# Patient Record
Sex: Female | Born: 1980 | Race: Black or African American | Hispanic: No | Marital: Married | Smoking: Never smoker
Health system: Southern US, Community
[De-identification: ages and names within clinical notes are randomized; demographics above are authoritative.]

## PROBLEM LIST (undated history)

## (undated) ENCOUNTER — Inpatient Hospital Stay (HOSPITAL_COMMUNITY): Payer: Self-pay

## (undated) DIAGNOSIS — R000 Tachycardia, unspecified: Secondary | ICD-10-CM

## (undated) DIAGNOSIS — O30009 Twin pregnancy, unspecified number of placenta and unspecified number of amniotic sacs, unspecified trimester: Secondary | ICD-10-CM

## (undated) DIAGNOSIS — N390 Urinary tract infection, site not specified: Secondary | ICD-10-CM

## (undated) DIAGNOSIS — N979 Female infertility, unspecified: Secondary | ICD-10-CM

## (undated) DIAGNOSIS — J019 Acute sinusitis, unspecified: Secondary | ICD-10-CM

## (undated) DIAGNOSIS — R87629 Unspecified abnormal cytological findings in specimens from vagina: Secondary | ICD-10-CM

## (undated) DIAGNOSIS — N898 Other specified noninflammatory disorders of vagina: Secondary | ICD-10-CM

## (undated) DIAGNOSIS — Z8759 Personal history of other complications of pregnancy, childbirth and the puerperium: Secondary | ICD-10-CM

## (undated) DIAGNOSIS — E282 Polycystic ovarian syndrome: Secondary | ICD-10-CM

## (undated) DIAGNOSIS — N926 Irregular menstruation, unspecified: Secondary | ICD-10-CM

## (undated) DIAGNOSIS — R8290 Unspecified abnormal findings in urine: Secondary | ICD-10-CM

## (undated) HISTORY — DX: Other specified noninflammatory disorders of vagina: N89.8

## (undated) HISTORY — DX: Twin pregnancy, unspecified number of placenta and unspecified number of amniotic sacs, unspecified trimester: O30.009

## (undated) HISTORY — DX: Irregular menstruation, unspecified: N92.6

## (undated) HISTORY — PX: COLPOSCOPY: SHX161

## (undated) HISTORY — DX: Tachycardia, unspecified: R00.0

## (undated) HISTORY — DX: Polycystic ovarian syndrome: E28.2

## (undated) HISTORY — DX: Unspecified abnormal findings in urine: R82.90

## (undated) HISTORY — DX: Female infertility, unspecified: N97.9

## (undated) HISTORY — PX: ENDOMETRIAL BIOPSY: SHX622

## (undated) HISTORY — DX: Acute sinusitis, unspecified: J01.90

## (undated) HISTORY — DX: Urinary tract infection, site not specified: N39.0

---

## 2013-06-01 LAB — OB RESULTS CONSOLE ABO/RH: RH TYPE: POSITIVE

## 2013-06-01 LAB — OB RESULTS CONSOLE GC/CHLAMYDIA
CHLAMYDIA, DNA PROBE: NEGATIVE
Gonorrhea: NEGATIVE

## 2013-06-01 LAB — OB RESULTS CONSOLE RPR: RPR: NONREACTIVE

## 2013-06-01 LAB — OB RESULTS CONSOLE RUBELLA ANTIBODY, IGM: Rubella: IMMUNE

## 2013-06-01 LAB — OB RESULTS CONSOLE HEPATITIS B SURFACE ANTIGEN: Hepatitis B Surface Ag: NEGATIVE

## 2013-06-01 LAB — OB RESULTS CONSOLE HIV ANTIBODY (ROUTINE TESTING): HIV: NONREACTIVE

## 2013-06-01 LAB — OB RESULTS CONSOLE ANTIBODY SCREEN: ANTIBODY SCREEN: NEGATIVE

## 2013-09-25 ENCOUNTER — Encounter: Payer: Self-pay | Admitting: Cardiovascular Disease

## 2013-09-25 ENCOUNTER — Ambulatory Visit (INDEPENDENT_AMBULATORY_CARE_PROVIDER_SITE_OTHER): Payer: BC Managed Care – PPO | Admitting: Cardiovascular Disease

## 2013-09-25 VITALS — BP 112/70 | HR 105 | Ht 65.0 in | Wt 282.8 lb

## 2013-09-25 DIAGNOSIS — R002 Palpitations: Secondary | ICD-10-CM | POA: Insufficient documentation

## 2013-09-25 DIAGNOSIS — R000 Tachycardia, unspecified: Secondary | ICD-10-CM

## 2013-09-25 NOTE — Progress Notes (Signed)
HPI:  33 year old woman presenting for evaluation of heart palpitations. The patient is a pediatrician. She is at [redacted] weeks gestation with plans. This is her first pregnancy. Over the past few weeks she has experienced a feeling that her heart is beating harder. She has noted elevated heart rate. Her heart rhythm has been regular by her report. With physical activity her heart rate increases even further. She has experienced these symptoms at rest and with activity. She has mild dyspnea and feels like this is a normal part of her pregnancy. She denies any other associated symptoms. She specifically denies lightheadedness, weakness, chest pain or pressure, orthopnea, PND, or leg swelling.  The patient has no history of cardiac problems. There is no family history of cardiac disease or cardiomyopathy. There is no arrhythmia in her family. She does not drink excessive caffeine. She does not drink alcohol or smoke cigarettes. Her pregnancy has otherwise been uneventful. She does admit to anemia and has recently started on iron. Her last hemoglobin was approximately 10 with her baseline running around 12 mg/dL.  Outpatient Encounter Prescriptions as of 09/25/2013  Medication Sig  . Calcium Carbonate Antacid (TUMS PO) Take by mouth as needed.  . Ferrous Sulfate (IRON SUPPLEMENT PO) Take by mouth daily.  . metFORMIN (GLUMETZA) 500 MG (MOD) 24 hr tablet Take 500 mg by mouth daily with breakfast.  . Prenatal Vit-Fe Sulfate-FA (PRENATAL VITAMIN PO) Take by mouth daily.  . [DISCONTINUED] clotrimazole-betamethasone (LOTRISONE) cream   . [DISCONTINUED] terconazole (TERAZOL 3) 80 MG vaginal suppository     Review of patient's allergies indicates no known allergies.  Past Medical History  Diagnosis Date  . Polycystic ovaries     PCOS- on metformin  . Acute sinusitis   . Urinary tract infectious disease   . Leukorrhea   . Irregular periods   . Female infertility     clomid with the pregnancy  . Twin  pregnancy     Di/Di growth U/S every 4 weeks  . Tachycardia   . Cloudy urine     Urine culture negative 05/2013    Past Surgical History  Procedure Laterality Date  . Endometrial biopsy      11/2012, GYN  . Colonoscopy      09/2002    History   Social History  . Marital Status: Married    Spouse Name: N/A    Number of Children: N/A  . Years of Education: N/A   Occupational History  . Not on file.   Social History Main Topics  . Smoking status: Never Smoker   . Smokeless tobacco: Not on file  . Alcohol Use: Not on file  . Drug Use: Not on file  . Sexual Activity: Not on file   Other Topics Concern  . Not on file   Social History Narrative  . No narrative on file    Family History  Problem Relation Age of Onset  . Breast cancer Mother [id number]  . Hypertension Mother   . Hypertension Father     ROS:  General: no fevers/chills/night sweats Eyes: no blurry vision, diplopia, or amaurosis ENT: no sore throat or hearing loss Resp: no cough, wheezing, or hemoptysis CV: no edema or palpitations GI: no abdominal pain, nausea, vomiting, diarrhea, or constipation GU: no dysuria, frequency, or hematuria Skin: no rash Neuro: no headache, numbness, tingling, or weakness of extremities Musculoskeletal: no joint pain or swelling Heme: no bleeding, DVT, or easy bruising Endo: no polydipsia or polyuria  BP 112/70  Pulse 105  Ht 5\' 5"  (1.651 m)  Wt 282 lb 12.8 oz (128.277 kg)  BMI 47.06 kg/m2  PHYSICAL EXAM: Pt is alert and oriented, pleasant woman in no distress. HEENT: normal Neck: JVP normal. Carotid upstrokes normal without bruits. No thyromegaly. Lungs: equal expansion, clear bilaterally CV: Apex is discrete and nondisplaced, RRR with grade 1-2/6 systolic flow murmur at the left upper sternal border Abd: soft, NT Back: no CVA tenderness Ext: no C/C/E        DP/PT pulses intact and = Skin: warm and dry without rash   EKG:  Normal sinus rhythm with sinus  arrhythmia, heart rate 88 beats per minute.  ASSESSMENT AND PLAN: 33 year old woman at 4623 weeks gestation with twins presenting with tachypalpitations. Symptoms sound primarily consistent with a steady and elevated heart rate. I suspect this is physiologic related to her pregnancy and anemia. Her exam is benign and consistent with pregnancy. She has no other associated symptoms. I advised a period of watchful waiting while her iron supplement in order to correct anemia. If symptoms persist later in pregnancy or worsen, I would be inclined to check an echocardiogram and possibly even a 24-hour Holter monitor. However, I feel the diagnostic yield on these studies as quite low at present. The patient is comfortable with this plan and will call back if symptoms worsen.  Tonny BollmanMichael Sri Clegg 09/25/2013 5:19 PM

## 2013-09-25 NOTE — Patient Instructions (Signed)
Your physician recommends that you schedule a follow-up appointment as needed.   If your pulse remains elevated please call the office and we will schedule an echocardiogram and 24 hour holter monitor.

## 2013-12-09 ENCOUNTER — Encounter: Payer: BC Managed Care – PPO | Attending: Obstetrics and Gynecology

## 2013-12-09 VITALS — Ht 65.0 in | Wt 279.3 lb

## 2013-12-09 DIAGNOSIS — O99810 Abnormal glucose complicating pregnancy: Secondary | ICD-10-CM

## 2013-12-09 DIAGNOSIS — Z713 Dietary counseling and surveillance: Secondary | ICD-10-CM | POA: Insufficient documentation

## 2013-12-09 NOTE — Progress Notes (Signed)
  Patient was seen on 12/09/13 for Gestational Diabetes self-management class at the [facility]. The following learning objectives were met by the patient during this course:   States the definition of Gestational Diabetes  States why dietary management is important in controlling blood glucose  Describes the effects of carbohydrates on blood glucose levels  Demonstrates ability to create a balanced meal plan  Demonstrates carbohydrate counting   States when to check blood glucose levels  Demonstrates proper blood glucose monitoring techniques  States the effect of stress and exercise on blood glucose levels  States the importance of limiting caffeine and abstaining from alcohol and smoking  Plan:  Aim for 2 Carb Choices per meal (30 grams) +/- 1 either way for breakfast Aim for 3 Carb Choices per meal (45 grams) +/- 1 either way from lunch and dinner Aim for 1-2 Carbs per snack Begin reading food labels for Total Carbohydrate and sugar grams of foods Consider  increasing your activity level by walking daily as tolerated Begin checking BG before breakfast and 2 hours after first bit of breakfast, lunch and dinner after  as directed by MD  Take medication  as directed by MD  Blood glucose monitor given:  One Touch Ultra 2 Self Monitoring Kit Lot # [id number] X Exp: 10/2014 Blood glucose reading: 80m/dl  Patient instructed to monitor glucose levels: FBS: 60 - <90 2 hour: <120  Patient received the following handouts:  Nutrition Diabetes and Pregnancy  Carbohydrate Counting List  Meal Planning worksheet  Patient will be seen for follow-up as needed.

## 2013-12-30 ENCOUNTER — Inpatient Hospital Stay (HOSPITAL_COMMUNITY)
Admission: AD | Admit: 2013-12-30 | Discharge: 2013-12-30 | Disposition: A | Discharge status: Home / Self Care | Payer: BC Managed Care – PPO | Source: Ambulatory Visit | Attending: Obstetrics & Gynecology | Admitting: Obstetrics & Gynecology

## 2013-12-30 ENCOUNTER — Encounter (HOSPITAL_COMMUNITY): Payer: Self-pay

## 2013-12-30 DIAGNOSIS — O4700 False labor before 37 completed weeks of gestation, unspecified trimester: Secondary | ICD-10-CM | POA: Insufficient documentation

## 2013-12-30 DIAGNOSIS — R030 Elevated blood-pressure reading, without diagnosis of hypertension: Secondary | ICD-10-CM | POA: Insufficient documentation

## 2013-12-30 DIAGNOSIS — O99891 Other specified diseases and conditions complicating pregnancy: Secondary | ICD-10-CM | POA: Insufficient documentation

## 2013-12-30 DIAGNOSIS — O9989 Other specified diseases and conditions complicating pregnancy, childbirth and the puerperium: Principal | ICD-10-CM

## 2013-12-30 LAB — COMPREHENSIVE METABOLIC PANEL
ALT: 10 U/L (ref 0–35)
ANION GAP: 15 (ref 5–15)
AST: 18 U/L (ref 0–37)
Albumin: 2.8 g/dL — ABNORMAL LOW (ref 3.5–5.2)
Alkaline Phosphatase: 189 U/L — ABNORMAL HIGH (ref 39–117)
BUN: 3 mg/dL — AB (ref 6–23)
CALCIUM: 10 mg/dL (ref 8.4–10.5)
CHLORIDE: 103 meq/L (ref 96–112)
CO2: 18 mEq/L — ABNORMAL LOW (ref 19–32)
Creatinine, Ser: 0.59 mg/dL (ref 0.50–1.10)
GLUCOSE: 112 mg/dL — AB (ref 70–99)
Potassium: 3.7 mEq/L (ref 3.7–5.3)
Sodium: 136 mEq/L — ABNORMAL LOW (ref 137–147)
Total Bilirubin: 0.5 mg/dL (ref 0.3–1.2)
Total Protein: 7.1 g/dL (ref 6.0–8.3)

## 2013-12-30 LAB — URIC ACID: Uric Acid, Serum: 5.1 mg/dL (ref 2.4–7.0)

## 2013-12-30 LAB — CBC
HCT: 35.9 % — ABNORMAL LOW (ref 36.0–46.0)
Hemoglobin: 12.1 g/dL (ref 12.0–15.0)
MCH: 30 pg (ref 26.0–34.0)
MCHC: 33.7 g/dL (ref 30.0–36.0)
MCV: 88.9 fL (ref 78.0–100.0)
Platelets: 258 10*3/uL (ref 150–400)
RBC: 4.04 MIL/uL (ref 3.87–5.11)
RDW: 14.1 % (ref 11.5–15.5)
WBC: 10.5 10*3/uL (ref 4.0–10.5)

## 2013-12-30 LAB — LACTATE DEHYDROGENASE: LDH: 152 U/L (ref 94–250)

## 2013-12-30 LAB — OB RESULTS CONSOLE GBS: STREP GROUP B AG: POSITIVE

## 2013-12-30 LAB — PROTEIN / CREATININE RATIO, URINE
Creatinine, Urine: 65.23 mg/dL
Protein Creatinine Ratio: 0.13 (ref 0.00–0.15)
TOTAL PROTEIN, URINE: 8.2 mg/dL

## 2013-12-30 MED ORDER — LABETALOL HCL 100 MG PO TABS: 200.0000 mg | ORAL_TABLET | Freq: Two times a day (BID) | ORAL | Status: DC

## 2013-12-30 NOTE — MAU Note (Signed)
Patient states she had a regular visit today and her blood pressure was elevated. Patient denies any S/S of preeclampsia.Has some mild contractions, no bleeding or leaking. Reports good fetal movement.

## 2013-12-30 NOTE — MAU Note (Signed)
Pt. Sent over to the office with high blood pressure. Last visit at the doctors was 120/70. Pt. Denies any other symptoms. Pt. States that BP in the office today was 150/100

## 2013-12-30 NOTE — Discharge Instructions (Signed)
Fetal Movement Counts °Patient Name: __________________________________________________ Patient Due Date: ____________________ °Performing a fetal movement count is highly recommended in high-risk pregnancies, but it is good for every pregnant woman to do. Your health care provider may ask you to start counting fetal movements at 28 weeks of the pregnancy. Fetal movements often increase: °· After eating a full meal. °· After physical activity. °· After eating or drinking something sweet or cold. °· At rest. °Pay attention to when you feel the baby is most active. This will help you notice a pattern of your baby's sleep and wake cycles and what factors contribute to an increase in fetal movement. It is important to perform a fetal movement count at the same time each day when your baby is normally most active.  °HOW TO COUNT FETAL MOVEMENTS °1. Find a quiet and comfortable area to sit or lie down on your left side. Lying on your left side provides the best blood and oxygen circulation to your baby. °2. Write down the day and time on a sheet of paper or in a journal. °3. Start counting kicks, flutters, swishes, rolls, or jabs in a 2-hour period. You should feel at least 10 movements within 2 hours. °4. If you do not feel 10 movements in 2 hours, wait 2-3 hours and count again. Look for a change in the pattern or not enough counts in 2 hours. °SEEK MEDICAL CARE IF: °· You feel less than 10 counts in 2 hours, tried twice. °· There is no movement in over an hour. °· The pattern is changing or taking longer each day to reach 10 counts in 2 hours. °· You feel the baby is not moving as he or she usually does. °Date: ____________ Movements: ____________ Start time: ____________ Finish time: ____________  °Date: ____________ Movements: ____________ Start time: ____________ Finish time: ____________ °Date: ____________ Movements: ____________ Start time: ____________ Finish time: ____________ °Date: ____________ Movements:  ____________ Start time: ____________ Finish time: ____________ °Date: ____________ Movements: ____________ Start time: ____________ Finish time: ____________ °Date: ____________ Movements: ____________ Start time: ____________ Finish time: ____________ °Date: ____________ Movements: ____________ Start time: ____________ Finish time: ____________ °Date: ____________ Movements: ____________ Start time: ____________ Finish time: ____________  °Date: ____________ Movements: ____________ Start time: ____________ Finish time: ____________ °Date: ____________ Movements: ____________ Start time: ____________ Finish time: ____________ °Date: ____________ Movements: ____________ Start time: ____________ Finish time: ____________ °Date: ____________ Movements: ____________ Start time: ____________ Finish time: ____________ °Date: ____________ Movements: ____________ Start time: ____________ Finish time: ____________ °Date: ____________ Movements: ____________ Start time: ____________ Finish time: ____________ °Date: ____________ Movements: ____________ Start time: ____________ Finish time: ____________  °Date: ____________ Movements: ____________ Start time: ____________ Finish time: ____________ °Date: ____________ Movements: ____________ Start time: ____________ Finish time: ____________ °Date: ____________ Movements: ____________ Start time: ____________ Finish time: ____________ °Date: ____________ Movements: ____________ Start time: ____________ Finish time: ____________ °Date: ____________ Movements: ____________ Start time: ____________ Finish time: ____________ °Date: ____________ Movements: ____________ Start time: ____________ Finish time: ____________ °Date: ____________ Movements: ____________ Start time: ____________ Finish time: ____________  °Date: ____________ Movements: ____________ Start time: ____________ Finish time: ____________ °Date: ____________ Movements: ____________ Start time: ____________ Finish  time: ____________ °Date: ____________ Movements: ____________ Start time: ____________ Finish time: ____________ °Date: ____________ Movements: ____________ Start time: ____________ Finish time: ____________ °Date: ____________ Movements: ____________ Start time: ____________ Finish time: ____________ °Date: ____________ Movements: ____________ Start time: ____________ Finish time: ____________ °Date: ____________ Movements: ____________ Start time: ____________ Finish time: ____________  °Date: ____________ Movements: ____________ Start time: ____________ Finish   time: ____________ Date: ____________ Movements: ____________ Start time: ____________ Doreatha Martin time: ____________ Date: ____________ Movements: ____________ Start time: ____________ Doreatha Martin time: ____________ Date: ____________ Movements: ____________ Start time: ____________ Doreatha Martin time: ____________ Date: ____________ Movements: ____________ Start time: ____________ Doreatha Martin time: ____________ Date: ____________ Movements: ____________ Start time: ____________ Doreatha Martin time: ____________ Date: ____________ Movements: ____________ Start time: ____________ Doreatha Martin time: ____________  Date: ____________ Movements: ____________ Start time: ____________ Doreatha Martin time: ____________ Date: ____________ Movements: ____________ Start time: ____________ Doreatha Martin time: ____________ Date: ____________ Movements: ____________ Start time: ____________ Doreatha Martin time: ____________ Date: ____________ Movements: ____________ Start time: ____________ Doreatha Martin time: ____________ Date: ____________ Movements: ____________ Start time: ____________ Doreatha Martin time: ____________ Date: ____________ Movements: ____________ Start time: ____________ Doreatha Martin time: ____________ Date: ____________ Movements: ____________ Start time: ____________ Doreatha Martin time: ____________  Date: ____________ Movements: ____________ Start time: ____________ Doreatha Martin time: ____________ Date: ____________  Movements: ____________ Start time: ____________ Doreatha Martin time: ____________ Date: ____________ Movements: ____________ Start time: ____________ Doreatha Martin time: ____________ Date: ____________ Movements: ____________ Start time: ____________ Doreatha Martin time: ____________ Date: ____________ Movements: ____________ Start time: ____________ Doreatha Martin time: ____________ Date: ____________ Movements: ____________ Start time: ____________ Doreatha Martin time: ____________ Date: ____________ Movements: ____________ Start time: ____________ Doreatha Martin time: ____________  Date: ____________ Movements: ____________ Start time: ____________ Doreatha Martin time: ____________ Date: ____________ Movements: ____________ Start time: ____________ Doreatha Martin time: ____________ Date: ____________ Movements: ____________ Start time: ____________ Doreatha Martin time: ____________ Date: ____________ Movements: ____________ Start time: ____________ Doreatha Martin time: ____________ Date: ____________ Movements: ____________ Start time: ____________ Doreatha Martin time: ____________ Date: ____________ Movements: ____________ Start time: ____________ Doreatha Martin time: ____________ Document Released: 06/27/2006 Document Revised: 10/12/2013 Document Reviewed: 03/24/2012 [employer] Patient Information 2015 [employer]. This information is not intended to replace advice given to you by your health care provider. Make sure you discuss any questions you have with your health care provider.  Preeclampsia and Eclampsia Preeclampsia is a serious condition that develops only during pregnancy. It is also called toxemia of pregnancy. This condition causes high blood pressure along with other symptoms, such as swelling and headaches. These may develop as the condition gets worse. Preeclampsia may occur 20 weeks or later into your pregnancy.  Diagnosing and treating preeclampsia early is very important. If not treated early, it can cause serious problems for you and your baby. One problem it can  lead to is eclampsia, which is a condition that causes muscle jerking or shaking (convulsions) in the mother. Delivering your baby is the best treatment for preeclampsia or eclampsia.  RISK FACTORS The cause of preeclampsia is not known. You may be more likely to develop preeclampsia if you have certain risk factors. These include:   Being pregnant for the first time.  Having preeclampsia in a past pregnancy.  Having a family history of preeclampsia.  Having high blood pressure.  Being pregnant with twins or triplets.  Being 12 or older.  Being African American.  Having kidney disease or diabetes.  Having medical conditions such as lupus or blood diseases.  Being very overweight (obese). SIGNS AND SYMPTOMS  The earliest signs of preeclampsia are:  High blood pressure.  Increased protein in your urine. Your health care provider will check for this at every prenatal visit. Other symptoms that can develop include:   Severe headaches.  Sudden weight gain.  Swelling of your hands, face, legs, and feet.  Feeling sick to your stomach (nauseous) and throwing up (vomiting).  Vision problems (blurred or double vision).  Numbness in your face, arms, legs, and feet.  Dizziness.  Slurred speech.  Sensitivity  to bright lights.  Abdominal pain. DIAGNOSIS  There are no screening tests for preeclampsia. Your health care provider will ask you about symptoms and check for signs of preeclampsia during your prenatal visits. You may also have tests, including:  Urine testing.  Blood testing.  Checking your baby's heart rate.  Checking the health of your baby and your placenta using images created with sound waves (ultrasound). TREATMENT  You can work out the best treatment approach together with your health care provider. It is very important to keep all prenatal appointments. If you have an increased risk of preeclampsia, you may need more frequent prenatal exams.  Your  health care provider may prescribe bed rest.  You may have to eat as little salt as possible.  You may need to take medicine to lower your blood pressure if the condition does not respond to more conservative measures.  You may need to stay in the hospital if your condition is severe. There, treatment will focus on controlling your blood pressure and fluid retention. You may also need to take medicine to prevent seizures.  If the condition gets worse, your baby may need to be delivered early to protect you and the baby. You may have your labor started with medicine (be induced), or you may have a cesarean delivery.  Preeclampsia usually goes away after the baby is born. HOME CARE INSTRUCTIONS   Only take over-the-counter or prescription medicines as directed by your health care provider.  Lie on your left side while resting. This keeps pressure off your baby.  Elevate your feet while resting.  Get regular exercise. Ask your health care provider what type of exercise is safe for you.  Avoid caffeine and alcohol.  Do not smoke.  Drink 6-8 glasses of water every day.  Eat a balanced diet that is low in salt. Do not add salt to your food.  Avoid stressful situations as much as possible.  Get plenty of rest and sleep.  Keep all prenatal appointments and tests as scheduled. SEEK MEDICAL CARE IF:  You are gaining more weight than expected.  You have any headaches, abdominal pain, or nausea.  You are bruising more than usual.  You feel dizzy or light-headed. SEEK IMMEDIATE MEDICAL CARE IF:   You develop sudden or severe swelling anywhere in your body. This usually happens in the legs.  You gain 5 lb (2.3 kg) or more in a week.  You have a severe headache, dizziness, problems with your vision, or confusion.  You have severe abdominal pain.  You have lasting nausea or vomiting.  You have a seizure.  You have trouble moving any part of your body.  You develop numbness  in your body.  You have trouble speaking.  You have any abnormal bleeding.  You develop a stiff neck.  You pass out. MAKE SURE YOU:   Understand these instructions.  Will watch your condition.  Will get help right away if you are not doing well or get worse. Document Released: 05/25/2000 Document Revised: 06/02/2013 Document Reviewed: 03/20/2013 [facility] Patient Information 2015 [place], [place]. This information is not intended to replace advice given to you by your health care provider. Make sure you discuss any questions you have with your health care provider.

## 2013-12-31 ENCOUNTER — Inpatient Hospital Stay (HOSPITAL_COMMUNITY)
Admission: AD | Admit: 2013-12-31 | Discharge: 2013-12-31 | Disposition: A | Discharge status: Home / Self Care | Payer: BC Managed Care – PPO | Source: Ambulatory Visit | Attending: Obstetrics and Gynecology | Admitting: Obstetrics and Gynecology

## 2013-12-31 ENCOUNTER — Encounter (HOSPITAL_COMMUNITY): Payer: Self-pay | Admitting: *Deleted

## 2013-12-31 DIAGNOSIS — E282 Polycystic ovarian syndrome: Secondary | ICD-10-CM | POA: Insufficient documentation

## 2013-12-31 DIAGNOSIS — O368190 Decreased fetal movements, unspecified trimester, not applicable or unspecified: Secondary | ICD-10-CM | POA: Insufficient documentation

## 2013-12-31 DIAGNOSIS — O30049 Twin pregnancy, dichorionic/diamniotic, unspecified trimester: Secondary | ICD-10-CM | POA: Insufficient documentation

## 2013-12-31 DIAGNOSIS — O139 Gestational [pregnancy-induced] hypertension without significant proteinuria, unspecified trimester: Secondary | ICD-10-CM | POA: Insufficient documentation

## 2013-12-31 DIAGNOSIS — O30009 Twin pregnancy, unspecified number of placenta and unspecified number of amniotic sacs, unspecified trimester: Secondary | ICD-10-CM | POA: Insufficient documentation

## 2013-12-31 NOTE — MAU Provider Note (Signed)
History     CSN: [id number]  Arrival date and time: 12/31/13 1930   None     Chief Complaint  Patient presents with  . Decreased Fetal Movement   HPI Comments: Pt is a G1P0 at 11wks w di/di twins, arrives after calling CNM w c/o dec FM w baby B. Was sleeping all day and when woke up about 4pm, noticed dec movement, tried eating and PO hydration. Had BPP yesterday 8/8 x2. Just started on PO labetalol today for gest HTN, seen in MAU yesterday, all labs were normal.      Past Medical History  Diagnosis Date  . Polycystic ovaries     PCOS- on metformin  . Acute sinusitis   . Urinary tract infectious disease   . Leukorrhea   . Irregular periods   . Female infertility     clomid with the pregnancy  . Twin pregnancy     Di/Di growth U/S every 4 weeks  . Tachycardia   . Cloudy urine     Urine culture negative 05/2013  . Polycystic ovarian syndrome     Past Surgical History  Procedure Laterality Date  . Endometrial biopsy      11/2012, GYN  . Colonoscopy      09/2002    Family History  Problem Relation Age of Onset  . Breast cancer Mother 24  . Hypertension Mother   . Hypertension Father     History  Substance Use Topics  . Smoking status: Never Smoker   . Smokeless tobacco: Not on file  . Alcohol Use: No    Allergies: No Known Allergies  Prescriptions prior to admission  Medication Sig Dispense Refill  . acetaminophen (TYLENOL) 500 MG tablet Take 500 mg by mouth every 6 (six) hours as needed (used for restless leg syndrome).       . Ferrous Sulfate (IRON SUPPLEMENT PO) Take by mouth daily. Iron supplement is used as a multivitamin.  Prenatal tablets were stopped when iron tablet started.      . labetalol (NORMODYNE) 100 MG tablet Take 100 mg by mouth 2 (two) times daily.      . lansoprazole (PREVACID) 30 MG capsule Take 30 mg by mouth daily at 12 noon.      . metFORMIN (GLUCOPHAGE) 500 MG tablet Take 500 mg by mouth daily with supper.      . ondansetron  (ZOFRAN) 4 MG tablet Take 4 mg by mouth every 8 (eight) hours as needed for nausea or vomiting.        Review of Systems  All other systems reviewed and are negative.  Physical Exam   Blood pressure 137/80, pulse 101, temperature 98.5 F (36.9 C), temperature source Oral, resp. rate 18, height 5\' 5"  (1.651 m), weight 274 lb (124.286 kg), SpO2 99.00%.  Physical Exam  Nursing note and vitals reviewed. Constitutional: She is oriented to person, place, and time. She appears well-developed and well-nourished.  HENT:  Head: Normocephalic.  Eyes: Pupils are equal, round, and reactive to light.  Neck: Normal range of motion.  Cardiovascular: Normal rate.   Respiratory: Effort normal.  GI: Soft.  Musculoskeletal: Normal range of motion.  Neurological: She is alert and oriented to person, place, and time. She has normal reflexes.  Skin: Skin is warm and dry.  Psychiatric: She has a normal mood and affect. Her behavior is normal.    MAU Course  Procedures   Assessment and Plan  IUP at 37wks NST reactive x2 toco UI  dc'd home in stable condition Has f/u in office tmrw for BP check rv'd labor and FKC   Dennisse Swader M 12/31/2013, 8:01 PM

## 2013-12-31 NOTE — MAU Note (Signed)
Patient presents to MAU for decreased fetal movement. States has felt less movement from baby B than normal. Denies LOF, VB, or contractions at this time.

## 2013-12-31 NOTE — Discharge Instructions (Signed)
Hypertension During Pregnancy °Hypertension, or high blood pressure, is when there is extra pressure inside your blood vessels that carry blood from the heart to the rest of your body (arteries). It can happen at any time in life, including pregnancy. Hypertension during pregnancy can cause problems for you and your baby. Your baby might not weigh as much as he or she should at birth or might be born early (premature). Very bad cases of hypertension during pregnancy can be life-threatening.  °Different types of hypertension can occur during pregnancy. These include: °· Chronic hypertension. This happens when a woman has hypertension before pregnancy and it continues during pregnancy. °· Gestational hypertension. This is when hypertension develops during pregnancy. °· Preeclampsia or toxemia of pregnancy. This is a very serious type of hypertension that develops only during pregnancy. It affects the whole body and can be very dangerous for both mother and baby.   °Gestational hypertension and preeclampsia usually go away after your baby is born. Your blood pressure will likely stabilize within 6 weeks. Women who have hypertension during pregnancy have a greater chance of developing hypertension later in life or with future pregnancies. °RISK FACTORS °There are certain factors that make it more likely for you to develop hypertension during pregnancy. These include: °· Having hypertension before pregnancy. °· Having hypertension during a previous pregnancy. °· Being overweight. °· Being older than 40 years. °· Being pregnant with more than one baby. °· Having diabetes or kidney problems. °SIGNS AND SYMPTOMS °Chronic and gestational hypertension rarely cause symptoms. Preeclampsia has symptoms, which may include: °· Increased protein in your urine. Your health care provider will check for this at every prenatal visit. °· Swelling of your hands and face. °· Rapid weight gain. °· Headaches. °· Visual changes. °· Being  bothered by light. °· Abdominal pain, especially in the upper right area. °· Chest pain. °· Shortness of breath. °· Increased reflexes. °· Seizures. These occur with a more severe form of preeclampsia, called eclampsia. °DIAGNOSIS  °You may be diagnosed with hypertension during a regular prenatal exam. At each prenatal visit, you may have: °· Your blood pressure checked. °· A urine test to check for protein in your urine. °The type of hypertension you are diagnosed with depends on when you developed it. It also depends on your specific blood pressure reading. °· Developing hypertension before 20 weeks of pregnancy is consistent with chronic hypertension. °· Developing hypertension after 20 weeks of pregnancy is consistent with gestational hypertension. °· Hypertension with increased urinary protein is diagnosed as preeclampsia. °· Blood pressure measurements that stay above 160 systolic or 110 diastolic are a sign of severe preeclampsia. °TREATMENT °Treatment for hypertension during pregnancy varies. Treatment depends on the type of hypertension and how serious it is. °· If you take medicine for chronic hypertension, you may need to switch medicines. °¨ Medicines called ACE inhibitors should not be taken during pregnancy. °¨ Low-dose aspirin may be suggested for women who have risk factors for preeclampsia. °· If you have gestational hypertension, you may need to take a blood pressure medicine that is safe during pregnancy. Your health care provider will recommend the correct medicine. °· If you have severe preeclampsia, you may need to be in the hospital. Health care providers will watch you and your baby very closely. You also may need to take medicine called magnesium sulfate to prevent seizures and lower blood pressure. °· Sometimes, an early delivery is needed. This may be the case if the condition worsens. It would be   done to protect you and your baby. The only cure for preeclampsia is delivery.  Your health  care provider may recommend that you take one low-dose aspirin (81 mg) each day to help prevent high blood pressure during your pregnancy if you are at risk for preeclampsia. You may be at risk for preeclampsia if:  You had preeclampsia or eclampsia during a previous pregnancy.  Your baby did not grow as expected during a previous pregnancy.  You experienced preterm birth with a previous pregnancy.  You experienced a separation of the placenta from the uterus (placental abruption) during a previous pregnancy.  You experienced the loss of your baby during a previous pregnancy.  You are pregnant with more than one baby.  You have other medical conditions, such as diabetes or an autoimmune disease. HOME CARE INSTRUCTIONS  Schedule and keep all of your regular prenatal care appointments. This is important.  Take medicines only as directed by your health care provider. Tell your health care provider about all medicines you take.  Eat as little salt as possible.  Get regular exercise.  Do not drink alcohol.  Do not use tobacco products.  Do not drink products with caffeine.  Lie on your left side when resting. SEEK IMMEDIATE MEDICAL CARE IF:  You have severe abdominal pain.  You have sudden swelling in your hands, ankles, or face.  You gain 4 pounds (1.8 kg) or more in 1 week.  You vomit repeatedly.  You have vaginal bleeding.  You do not feel your baby moving as much.  You have a headache.  You have blurred or double vision.  You have muscle twitching or spasms.  You have shortness of breath.  You have blue fingernails or lips.  You have blood in your urine. MAKE SURE YOU:  Understand these instructions.  Will watch your condition.  Will get help right away if you are not doing well or get worse. Document Released: 02/13/2011 Document Revised: 10/12/2013 Document Reviewed: 12/25/2012 [facility] Patient Information 2015 [employer], [place]. This information is not  intended to replace advice given to you by your health care provider. Make sure you discuss any questions you have with your health care provider.  Fetal Movement Counts Patient Name: __________________________________________________ Patient Due Date: ____________________ Performing a fetal movement count is highly recommended in high-risk pregnancies, but it is good for every pregnant woman to do. Your health care provider may ask you to start counting fetal movements at 28 weeks of the pregnancy. Fetal movements often increase:  After eating a full meal.  After physical activity.  After eating or drinking something sweet or cold.  At rest. Pay attention to when you feel the baby is most active. This will help you notice a pattern of your baby's sleep and wake cycles and what factors contribute to an increase in fetal movement. It is important to perform a fetal movement count at the same time each day when your baby is normally most active.  HOW TO COUNT FETAL MOVEMENTS 1. Find a quiet and comfortable area to sit or lie down on your left side. Lying on your left side provides the best blood and oxygen circulation to your baby. 2. Write down the day and time on a sheet of paper or in a journal. 3. Start counting kicks, flutters, swishes, rolls, or jabs in a 2-hour period. You should feel at least 10 movements within 2 hours. 4. If you do not feel 10 movements in 2 hours, wait 2-3 hours  and count again. Look for a change in the pattern or not enough counts in 2 hours. SEEK MEDICAL CARE IF:  You feel less than 10 counts in 2 hours, tried twice.  There is no movement in over an hour.  The pattern is changing or taking longer each day to reach 10 counts in 2 hours.  You feel the baby is not moving as he or she usually does. Date: ____________ Movements: ____________ Start time: ____________ Sierra MartinFinish time: ____________  Date: ____________ Movements: ____________ Start time: ____________  Sierra MartinFinish time: ____________ Date: ____________ Movements: ____________ Start time: ____________ Sierra MartinFinish time: ____________ Date: ____________ Movements: ____________ Start time: ____________ Sierra MartinFinish time: ____________ Date: ____________ Movements: ____________ Start time: ____________ Sierra MartinFinish time: ____________ Date: ____________ Movements: ____________ Start time: ____________ Sierra MartinFinish time: ____________ Date: ____________ Movements: ____________ Start time: ____________ Sierra MartinFinish time: ____________ Date: ____________ Movements: ____________ Start time: ____________ Sierra MartinFinish time: ____________  Date: ____________ Movements: ____________ Start time: ____________ Sierra MartinFinish time: ____________ Date: ____________ Movements: ____________ Start time: ____________ Sierra MartinFinish time: ____________ Date: ____________ Movements: ____________ Start time: ____________ Sierra MartinFinish time: ____________ Date: ____________ Movements: ____________ Start time: ____________ Sierra MartinFinish time: ____________ Date: ____________ Movements: ____________ Start time: ____________ Sierra MartinFinish time: ____________ Date: ____________ Movements: ____________ Start time: ____________ Sierra MartinFinish time: ____________ Date: ____________ Movements: ____________ Start time: ____________ Sierra MartinFinish time: ____________  Date: ____________ Movements: ____________ Start time: ____________ Sierra MartinFinish time: ____________ Date: ____________ Movements: ____________ Start time: ____________ Sierra MartinFinish time: ____________ Date: ____________ Movements: ____________ Start time: ____________ Sierra MartinFinish time: ____________ Date: ____________ Movements: ____________ Start time: ____________ Sierra MartinFinish time: ____________ Date: ____________ Movements: ____________ Start time: ____________ Sierra MartinFinish time: ____________ Date: ____________ Movements: ____________ Start time: ____________ Sierra MartinFinish time: ____________ Date: ____________ Movements: ____________ Start time: ____________ Sierra MartinFinish time: ____________  Date: ____________  Movements: ____________ Start time: ____________ Sierra MartinFinish time: ____________ Date: ____________ Movements: ____________ Start time: ____________ Sierra MartinFinish time: ____________ Date: ____________ Movements: ____________ Start time: ____________ Sierra MartinFinish time: ____________ Date: ____________ Movements: ____________ Start time: ____________ Sierra MartinFinish time: ____________ Date: ____________ Movements: ____________ Start time: ____________ Sierra MartinFinish time: ____________ Date: ____________ Movements: ____________ Start time: ____________ Sierra MartinFinish time: ____________ Date: ____________ Movements: ____________ Start time: ____________ Sierra MartinFinish time: ____________  Date: ____________ Movements: ____________ Start time: ____________ Sierra MartinFinish time: ____________ Date: ____________ Movements: ____________ Start time: ____________ Sierra MartinFinish time: ____________ Date: ____________ Movements: ____________ Start time: ____________ Sierra MartinFinish time: ____________ Date: ____________ Movements: ____________ Start time: ____________ Sierra MartinFinish time: ____________ Date: ____________ Movements: ____________ Start time: ____________ Sierra MartinFinish time: ____________ Date: ____________ Movements: ____________ Start time: ____________ Sierra MartinFinish time: ____________ Date: ____________ Movements: ____________ Start time: ____________ Sierra MartinFinish time: ____________  Date: ____________ Movements: ____________ Start time: ____________ Sierra MartinFinish time: ____________ Date: ____________ Movements: ____________ Start time: ____________ Sierra MartinFinish time: ____________ Date: ____________ Movements: ____________ Start time: ____________ Sierra MartinFinish time: ____________ Date: ____________ Movements: ____________ Start time: ____________ Sierra MartinFinish time: ____________ Date: ____________ Movements: ____________ Start time: ____________ Sierra MartinFinish time: ____________ Date: ____________ Movements: ____________ Start time: ____________ Sierra MartinFinish time: ____________ Date: ____________ Movements: ____________ Start time:  ____________ Sierra MartinFinish time: ____________  Date: ____________ Movements: ____________ Start time: ____________ Sierra MartinFinish time: ____________ Date: ____________ Movements: ____________ Start time: ____________ Sierra MartinFinish time: ____________ Date: ____________ Movements: ____________ Start time: ____________ Sierra MartinFinish time: ____________ Date: ____________ Movements: ____________ Start time: ____________ Sierra MartinFinish time: ____________ Date: ____________ Movements: ____________ Start time: ____________ Sierra MartinFinish time: ____________ Date: ____________ Movements: ____________ Start time: ____________ Sierra MartinFinish time: ____________ Date: ____________ Movements: ____________ Start time: ____________ Sierra MartinFinish time: ____________  Date: ____________ Movements: ____________ Start time: ____________ Sierra MartinFinish time: ____________ Date: ____________ Movements: ____________ Start time:  ____________ Finish time: ____________ °Date: ____________ Movements: ____________ Start time: ____________ Finish time: ____________ °Date: ____________ Movements: ____________ Start time: ____________ Finish time: ____________ °Date: ____________ Movements: ____________ Start time: ____________ Finish time: ____________ °Date: ____________ Movements: ____________ Start time: ____________ Finish time: ____________ °Document Released: 06/27/2006 Document Revised: 10/12/2013 Document Reviewed: 03/24/2012 °Exit[employer]® Patient Information ©2015 [employer]. This information is not intended to replace advice given to you by your health care provider. Make sure you discuss any questions you have with your health care provider. ° ° °

## 2014-01-08 ENCOUNTER — Inpatient Hospital Stay (HOSPITAL_COMMUNITY)
Admission: RE | Admit: 2014-01-08 | Discharge: 2014-01-13 | DRG: 765 | Disposition: A | Discharge status: Home / Self Care | Payer: BC Managed Care – PPO | Source: Ambulatory Visit | Attending: Obstetrics and Gynecology | Admitting: Obstetrics and Gynecology

## 2014-01-08 ENCOUNTER — Inpatient Hospital Stay (HOSPITAL_COMMUNITY): Payer: BC Managed Care – PPO

## 2014-01-08 ENCOUNTER — Telehealth (HOSPITAL_COMMUNITY): Payer: Self-pay | Admitting: *Deleted

## 2014-01-08 DIAGNOSIS — Z8249 Family history of ischemic heart disease and other diseases of the circulatory system: Secondary | ICD-10-CM

## 2014-01-08 DIAGNOSIS — D509 Iron deficiency anemia, unspecified: Secondary | ICD-10-CM

## 2014-01-08 DIAGNOSIS — E282 Polycystic ovarian syndrome: Secondary | ICD-10-CM | POA: Diagnosis present

## 2014-01-08 DIAGNOSIS — O30049 Twin pregnancy, dichorionic/diamniotic, unspecified trimester: Secondary | ICD-10-CM

## 2014-01-08 DIAGNOSIS — O30009 Twin pregnancy, unspecified number of placenta and unspecified number of amniotic sacs, unspecified trimester: Secondary | ICD-10-CM | POA: Diagnosis present

## 2014-01-08 DIAGNOSIS — Z22330 Carrier of Group B streptococcus: Secondary | ICD-10-CM

## 2014-01-08 DIAGNOSIS — IMO0002 Reserved for concepts with insufficient information to code with codable children: Secondary | ICD-10-CM | POA: Insufficient documentation

## 2014-01-08 DIAGNOSIS — O139 Gestational [pregnancy-induced] hypertension without significant proteinuria, unspecified trimester: Secondary | ICD-10-CM | POA: Diagnosis present

## 2014-01-08 DIAGNOSIS — N979 Female infertility, unspecified: Secondary | ICD-10-CM

## 2014-01-08 DIAGNOSIS — R002 Palpitations: Secondary | ICD-10-CM | POA: Diagnosis present

## 2014-01-08 DIAGNOSIS — Z823 Family history of stroke: Secondary | ICD-10-CM

## 2014-01-08 DIAGNOSIS — Z803 Family history of malignant neoplasm of breast: Secondary | ICD-10-CM

## 2014-01-08 DIAGNOSIS — O99892 Other specified diseases and conditions complicating childbirth: Secondary | ICD-10-CM | POA: Diagnosis present

## 2014-01-08 DIAGNOSIS — O9989 Other specified diseases and conditions complicating pregnancy, childbirth and the puerperium: Secondary | ICD-10-CM

## 2014-01-08 DIAGNOSIS — O133 Gestational [pregnancy-induced] hypertension without significant proteinuria, third trimester: Secondary | ICD-10-CM

## 2014-01-08 DIAGNOSIS — O9902 Anemia complicating childbirth: Secondary | ICD-10-CM | POA: Diagnosis present

## 2014-01-08 DIAGNOSIS — Z98891 History of uterine scar from previous surgery: Secondary | ICD-10-CM

## 2014-01-08 DIAGNOSIS — O99214 Obesity complicating childbirth: Secondary | ICD-10-CM

## 2014-01-08 DIAGNOSIS — O30042 Twin pregnancy, dichorionic/diamniotic, second trimester: Secondary | ICD-10-CM

## 2014-01-08 DIAGNOSIS — D649 Anemia, unspecified: Secondary | ICD-10-CM | POA: Insufficient documentation

## 2014-01-08 DIAGNOSIS — Z6841 Body Mass Index (BMI) 40.0 and over, adult: Secondary | ICD-10-CM

## 2014-01-08 DIAGNOSIS — E669 Obesity, unspecified: Secondary | ICD-10-CM | POA: Diagnosis present

## 2014-01-08 DIAGNOSIS — Z8742 Personal history of other diseases of the female genital tract: Secondary | ICD-10-CM | POA: Insufficient documentation

## 2014-01-08 DIAGNOSIS — B951 Streptococcus, group B, as the cause of diseases classified elsewhere: Secondary | ICD-10-CM | POA: Insufficient documentation

## 2014-01-08 DIAGNOSIS — Z833 Family history of diabetes mellitus: Secondary | ICD-10-CM

## 2014-01-08 LAB — CBC
HCT: 37.8 % (ref 36.0–46.0)
Hemoglobin: 12.8 g/dL (ref 12.0–15.0)
MCH: 29.6 pg (ref 26.0–34.0)
MCHC: 33.9 g/dL (ref 30.0–36.0)
MCV: 87.5 fL (ref 78.0–100.0)
PLATELETS: 268 10*3/uL (ref 150–400)
RBC: 4.32 MIL/uL (ref 3.87–5.11)
RDW: 14.3 % (ref 11.5–15.5)
WBC: 10 10*3/uL (ref 4.0–10.5)

## 2014-01-08 LAB — COMPREHENSIVE METABOLIC PANEL
ALT: 10 U/L (ref 0–35)
ANION GAP: 16 — AB (ref 5–15)
AST: 16 U/L (ref 0–37)
Albumin: 2.7 g/dL — ABNORMAL LOW (ref 3.5–5.2)
Alkaline Phosphatase: 210 U/L — ABNORMAL HIGH (ref 39–117)
BUN: 6 mg/dL (ref 6–23)
CALCIUM: 9.8 mg/dL (ref 8.4–10.5)
CO2: 18 meq/L — AB (ref 19–32)
CREATININE: 0.58 mg/dL (ref 0.50–1.10)
Chloride: 100 mEq/L (ref 96–112)
GLUCOSE: 97 mg/dL (ref 70–99)
Potassium: 4.1 mEq/L (ref 3.7–5.3)
Sodium: 134 mEq/L — ABNORMAL LOW (ref 137–147)
TOTAL PROTEIN: 6.8 g/dL (ref 6.0–8.3)
Total Bilirubin: 0.4 mg/dL (ref 0.3–1.2)

## 2014-01-08 LAB — LACTATE DEHYDROGENASE: LDH: 153 U/L (ref 94–250)

## 2014-01-08 LAB — URIC ACID: Uric Acid, Serum: 5.1 mg/dL (ref 2.4–7.0)

## 2014-01-08 MED ORDER — CITRIC ACID-SODIUM CITRATE 334-500 MG/5ML PO SOLN
30.0000 mL | ORAL | Status: DC | PRN
  Administered 2014-01-10: 30 mL via ORAL
  Filled 2014-01-08: qty 15

## 2014-01-08 MED ORDER — OXYTOCIN 40 UNITS IN LACTATED RINGERS INFUSION - SIMPLE MED: 62.5000 mL/h | INTRAVENOUS | Status: DC

## 2014-01-08 MED ORDER — ONDANSETRON HCL 4 MG/2ML IJ SOLN: 4.0000 mg | Freq: Four times a day (QID) | INTRAMUSCULAR | Status: DC | PRN

## 2014-01-08 MED ORDER — LIDOCAINE HCL (PF) 1 % IJ SOLN: 30.0000 mL | INTRAMUSCULAR | Status: DC | PRN

## 2014-01-08 MED ORDER — NALBUPHINE HCL 10 MG/ML IJ SOLN: 10.0000 mg | INTRAMUSCULAR | Status: DC | PRN

## 2014-01-08 MED ORDER — ACETAMINOPHEN 325 MG PO TABS: 650.0000 mg | ORAL_TABLET | ORAL | Status: DC | PRN

## 2014-01-08 MED ORDER — PENICILLIN G POTASSIUM 5000000 UNITS IJ SOLR
5.0000 10*6.[IU] | Freq: Once | INTRAVENOUS | Status: AC
  Administered 2014-01-08: 5 10*6.[IU] via INTRAVENOUS
  Filled 2014-01-08: qty 5

## 2014-01-08 MED ORDER — PENICILLIN G POTASSIUM 5000000 UNITS IJ SOLR
2.5000 10*6.[IU] | INTRAVENOUS | Status: DC
  Administered 2014-01-09 (×5): 2.5 10*6.[IU] via INTRAVENOUS
  Filled 2014-01-08 (×14): qty 2.5

## 2014-01-08 MED ORDER — MISOPROSTOL 25 MCG QUARTER TABLET
25.0000 ug | ORAL_TABLET | ORAL | Status: DC | PRN
  Administered 2014-01-08 – 2014-01-10 (×3): 25 ug via VAGINAL
  Filled 2014-01-08 (×3): qty 0.25

## 2014-01-08 MED ORDER — OXYTOCIN BOLUS FROM INFUSION: 500.0000 mL | INTRAVENOUS | Status: DC

## 2014-01-08 MED ORDER — MISOPROSTOL 200 MCG PO TABS
ORAL_TABLET | ORAL | Status: AC
  Filled 2014-01-08: qty 1

## 2014-01-08 MED ORDER — OXYTOCIN 40 UNITS IN LACTATED RINGERS INFUSION - SIMPLE MED
1.0000 m[IU]/min | INTRAVENOUS | Status: DC
  Administered 2014-01-09: 7 m[IU]/min via INTRAVENOUS
  Administered 2014-01-09 – 2014-01-10 (×2): 1 m[IU]/min via INTRAVENOUS
  Administered 2014-01-10: 3 m[IU]/min via INTRAVENOUS
  Filled 2014-01-08 (×2): qty 1000

## 2014-01-08 MED ORDER — IBUPROFEN 600 MG PO TABS: 600.0000 mg | ORAL_TABLET | Freq: Four times a day (QID) | ORAL | Status: DC | PRN

## 2014-01-08 MED ORDER — LACTATED RINGERS IV SOLN
INTRAVENOUS | Status: DC
  Administered 2014-01-08 – 2014-01-10 (×3): via INTRAVENOUS

## 2014-01-08 MED ORDER — TERBUTALINE SULFATE 1 MG/ML IJ SOLN: 0.2500 mg | Freq: Once | INTRAMUSCULAR | Status: AC | PRN

## 2014-01-08 MED ORDER — LACTATED RINGERS IV SOLN: 500.0000 mL | INTRAVENOUS | Status: DC | PRN

## 2014-01-08 MED ORDER — OXYCODONE-ACETAMINOPHEN 5-325 MG PO TABS: 1.0000 | ORAL_TABLET | ORAL | Status: DC | PRN

## 2014-01-08 NOTE — H&P (Signed)
Bryony Kaman is a 33 y.o. female, with di/di twins, G1P0 at 22 1/7 weeks, presenting for admission on the evening of 01/08/14 for induction due to twin gestation, gestational hypertension, elevated 1 hour GTT/intolerance of 3 hour GTT, with normal CBGs.  Babies were vtx/vtx on [place] 01/06/14, with cervix 2 cm, 50% on 12/24/13.  Denies leaking or bleeding, has noted +FM x 2.  Normal growth for both twins has been noted, with marginal insertion of cord on Twin A.     Patient Active Problem List   Diagnosis Date Noted  . Twin gestation, dichorionic diamniotic 01/08/2014  . Positive GBS test 01/08/2014  . Gestational hypertension--on Labetalol 01/08/2014  . Anemia 01/08/2014  . 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 FH: breast cancer in first degree relative when <15 years old--mother age 61 01/08/2014  . History of PCOS--on Metformin 01/08/2014  . Marginal insertion of umbilical cord--Twin A 01/08/2014  . Heart palpitations 09/25/2013    History of present pregnancy: Patient entered care at 6 +4 weeks.  She had conceived after several cycles of Clomid (conception on Clomid), had SHG  11/18/12 EDC of 01/21/14 was established by [place] at 6 4/7 weeks.   Anatomy scan:  18 weeks, with normal anatomy findings for both fetuses, but limited.  Twin A posterior placenta, Twin B posterior/lateral placenta, normal fluid x 2, cervix 4.4..   Additional [place] evaluations: 12 6/7 weeks for 1st trimester screen:  WNL   18 weeks for anatomy:  Normal growth, limited anatomy, cervix 4.4 cm, Twin A posterior placenta, possible marginal insertion. 20 6/7 weeks for f/u anatomy:  Normal growth x 2, marginal insertion of Twin A cord, cervix closed, Twin B posterior placenta, completion of normal anatomy 24 5/7 weeks for growth:  Normal growth x 2, normal fluid, cervix 3.68 28 1/7 weeks for growth:  Vtx/transverse, normal growth and fluid, cervix 3.83 32 1/7 weeks for growth:  Vtx/transverse, normal growth and  fluid, cervix 3.69 36 6/7 weeks for growth/presentation:  Vtx/Vtx, Twin A 6+8, Twin B 5+15, normal fluid, posterior placenta x 2. 37 6/7 weeks for presentation:  Vtx/vtx, normal fluid  Significant prenatal events:  Twin dx at [redacted] week gestation.  On Metformin prior to pregnancy due to PCOS, continued on 500 mg daily during pregnancy.  Treated for yeast at 18 weeks.  Seen by cardiologist at 24 weeks due to tachycardia, with negative w/u.  Fetal growth followed q 4 weeks until 32 weeks, with normal linear growth noted.  Marginal cord insertion noted Twin A.  Patient had elevated 1 hour glucola, but was intolerant of 3 hour GTT.  She was [facility] to [facility] and monitored her CBGs throughout the rest of the pregnancy, with normal values.  On Fe supplementation during pregnancy.  Positive GBS noted at 36 weeks.  BP noted to be elevated on 12/30/13, with patient sent to [facility] for [facility] evaluation.  Labs and PCR were WNL, and f/u 24 hour urine was WNL.  She was started on Labetalol at that time. Cervix was 2 cm, 50%, vtx, -3 on 12/30/13.   Last evaluation:  01/06/14, with US showing vtx/vtx presentation.  Induction at 38 1/7 weeks was discussed, with patient agreeable with plan.  OB History   Grav Para Term Preterm Abortions TAB SAB Ect Mult Living   1              Past Medical History  Diagnosis Date  . Polycystic ovaries     PCOS- on metformin  .  Acute sinusitis   . Urinary tract infectious disease   . Leukorrhea   . Irregular periods   . Female infertility     clomid with the pregnancy  . Twin pregnancy     Di/Di growth U/S every 4 weeks  . Tachycardia   . Cloudy urine     Urine culture negative 05/2013  . Polycystic ovarian syndrome    Past Surgical History  Procedure Laterality Date  . Endometrial biopsy      11/2012, GYN  . Colonoscopy      09/2002   Family History: family history includes Breast cancer (age of onset: 1448) in her mother; Hypertension in her father and mother. Mother with diabetes;  MGM breast cancer age 32; PGF CVA  Social History:  reports that she has never smoked. She does not have any smokeless tobacco history on file. She reports that she does not drink alcohol or use illicit drugs.  Patient is a pediatrician, married to the FOB Gerri Lins(Melvin Cendejas), is of African-American ethnicity, and of the [place].     Prenatal Transfer Tool  Maternal Diabetes:  Failed 1 hour GTT, intolerant of 3 hour GTT--has been monitoring CBGs, all WNl Genetic Screening: Normal 1st trimester screen and AFP Maternal Ultrasounds/Referrals: Normal--di/di twins, normal growth, marginal cord insertion Twin A Fetal Ultrasounds or other Referrals:  None Maternal Substance Abuse:  No Significant Maternal Medications:  Meds include: Other:   Labetalol 200 mg po BID, Fe, Metformin, Prevacid, Zofran Significant Maternal Lab Results: Lab values include: Group B Strep positive    ROS:  Occasional UCs, +FM  No Known Allergies     VSS stable at office visit 01/06/14--BP 120/78  Chest clear Heart RRR without murmur Abd gravid, NT, FH 39 cm Pelvic: 2 cm, 50%, -3 on exam 12/24/13, with vtx/vtx on US 01/06/14 Ext: DTR 2+, no clonus, trace edema  FHR: Twin A 145-150, Twin B 162, 154 on 01/06/14 UCs:  Occasional per patient.  Prenatal labs: ABO, Rh:  O+ Antibody:  Negative Rubella:   Immune RPR:   NR HBsAg:   Neg HIV:   NR GBS:  Positive Sickle cell/Hgb electrophoresis:  AA Pap:  10/2012 GC:  Negative 05/2013 Chlamydia:  05/2013 Genetic screenings:  Normal 1st trimester screen and AFP Glucola:  Elevated at 146, intolerant of 3 hour GTT--has been checking CBGs, all WNL Other:  Hgb 10.4 at NOB, 9.5 on 10/30/13, 10.5 on 12/09/13 TSH WNL 09/14/13 [facility] labs and PCR WNL on 12/30/13 24 hour urine WNL 01/01/14       Assessment/Plan: Twin IUP at 5938 1/7 weeks--di/di twins Positive GBS Gestational hypertension--on Labetalol Abnormal glucose metabolism Marginal cord insertion Twin A Patient is  a pediatrician  [facility]: [facility] Dr[facility] Normand Sloopillard Routine [facility] labor admission orders. Fetal positions will be evaluated at the time of admission. Induction method will be determined based on maternal/fetal assessment at time of admission. Plan GBS prophylaxis in active labor. Pain medication prn. Continue Labetalol 200 mg po BID. FBS q 4 hours or prn.  Yamil Oelke, [place], [place] 01/08/2014, 2:51 AM

## 2014-01-08 NOTE — Telephone Encounter (Signed)
Preadmission screen  

## 2014-01-08 NOTE — Progress Notes (Signed)
  Subjective: -Patient comfortable in bed. Reports irregular mild contractions.   Objective: There were no vitals taken for this visit.    Results for orders placed during the hospital encounter of 01/08/14 (from the past 24 hour(s))  CBC     Status: None   Collection Time    01/08/14  8:35 PM      Result Value Ref Range   WBC 10.0  4.0 - 10.5 K/uL   RBC 4.32  3.87 - 5.11 MIL/uL   Hemoglobin 12.8  12.0 - 15.0 g/dL   HCT 16.137.8  09.636.0 - 04.546.0 %   MCV 87.5  78.0 - 100.0 fL   MCH 29.6  26.0 - 34.0 pg   MCHC 33.9  30.0 - 36.0 g/dL   RDW 40.914.3  81.111.5 - 91.415.5 %   Platelets 268  150 - 400 K/uL  COMPREHENSIVE METABOLIC PANEL     Status: Abnormal   Collection Time    01/08/14  8:35 PM      Result Value Ref Range   Sodium 134 (*) 137 - 147 mEq/L   Potassium 4.1  3.7 - 5.3 mEq/L   Chloride 100  96 - 112 mEq/L   CO2 18 (*) 19 - 32 mEq/L   Glucose, Bld 97  70 - 99 mg/dL   BUN 6  6 - 23 mg/dL   Creatinine, Ser 7.820.58  0.50 - 1.10 mg/dL   Calcium 9.8  8.4 - 95.610.5 mg/dL   Total Protein 6.8  6.0 - 8.3 g/dL   Albumin 2.7 (*) 3.5 - 5.2 g/dL   AST 16  0 - 37 U/L   ALT 10  0 - 35 U/L   Alkaline Phosphatase 210 (*) 39 - 117 U/L   Total Bilirubin 0.4  0.3 - 1.2 mg/dL   GFR calc non Af Amer >90  >90 mL/min   GFR calc Af Amer >90  >90 mL/min   Anion gap 16 (*) 5 - 15  LACTATE DEHYDROGENASE     Status: None   Collection Time    01/08/14  8:35 PM      Result Value Ref Range   LDH 153  94 - 250 U/L  URIC ACID     Status: None   Collection Time    01/08/14  8:35 PM      Result Value Ref Range   Uric Acid, Serum 5.1  2.4 - 7.0 mg/dL     FHT: A 213Y130s bpm, Mod Var, -Decels, +Accels B 130s bpm, Mod Var, -Decels, +Accels UC:   Mild irregular, fundus soft, nt SVE:   Dilation: Closed Effacement (%): 50 Station: -2 Exam by:: J. Emily,CNM Membranes: Intact Pitocin: None Cytotec-Initial dose placed at 1015 with minimal difficulty  Assessment:  TIUP at 38.1wks Cat I FT x2 Gestational HTN IOL-[place]  Score 5 GBS Positive  Plan: -Place cytotec and observe  -No change in [facility] labs since 7/22 draw--PC Ratio pending -BP stable -Continue other mgmt as ordered  Kaige Whistler LYNN,CNM, MSN 01/08/2014, 10:07 PM

## 2014-01-08 NOTE — Progress Notes (Signed)
  Subjective: -Patient presents for IOL due to gestational hypertension of Di/Di Twin Gestation (males).  Discussed plan of care for tonight which patient verbalizes understanding.   Objective: There were no vitals taken for this visit.     FHT:  A: 130s bpm, Mod Var, -Decels, +Accels B: 130s bpm, Mod Var, -Decels, +Accels UC:  Mild to moderate contractions graphed every 1-534min  SVE:    Deferred until after US Membranes: Intact Pitocin: None  Assessment:  IUP at 38.1wks Cat 1 FT  X 2 Gestational Hypertension Twin Gestation GBS Positive   Plan: -Bedside US for Presentation -Will perform SVE after confirmed to decide on induction method -BP Q HR -Baseline [facility] Labs -Will update Dr. Audree CamelN. Fletcher once all results obtained -Continue other mgmt as ordered  Drake Center IncEMLY, Sierra Fletcher,CNM, MSN 01/08/2014, 8:56 PM

## 2014-01-09 ENCOUNTER — Encounter (HOSPITAL_COMMUNITY): Payer: Self-pay

## 2014-01-09 LAB — ABO/RH: ABO/RH(D): O POS

## 2014-01-09 LAB — COMPREHENSIVE METABOLIC PANEL
ALK PHOS: 199 U/L — AB (ref 39–117)
ALT: 11 U/L (ref 0–35)
ANION GAP: 12 (ref 5–15)
AST: 17 U/L (ref 0–37)
Albumin: 2.7 g/dL — ABNORMAL LOW (ref 3.5–5.2)
BILIRUBIN TOTAL: 0.4 mg/dL (ref 0.3–1.2)
BUN: 6 mg/dL (ref 6–23)
CO2: 20 meq/L (ref 19–32)
Calcium: 9.8 mg/dL (ref 8.4–10.5)
Chloride: 102 mEq/L (ref 96–112)
Creatinine, Ser: 0.63 mg/dL (ref 0.50–1.10)
GLUCOSE: 83 mg/dL (ref 70–99)
POTASSIUM: 4.2 meq/L (ref 3.7–5.3)
Sodium: 134 mEq/L — ABNORMAL LOW (ref 137–147)
Total Protein: 6.8 g/dL (ref 6.0–8.3)

## 2014-01-09 LAB — CBC
HEMATOCRIT: 36.5 % (ref 36.0–46.0)
HEMOGLOBIN: 12.1 g/dL (ref 12.0–15.0)
MCH: 29.5 pg (ref 26.0–34.0)
MCHC: 33.2 g/dL (ref 30.0–36.0)
MCV: 89 fL (ref 78.0–100.0)
Platelets: 229 10*3/uL (ref 150–400)
RBC: 4.1 MIL/uL (ref 3.87–5.11)
RDW: 14.3 % (ref 11.5–15.5)
WBC: 10.9 10*3/uL — ABNORMAL HIGH (ref 4.0–10.5)

## 2014-01-09 LAB — TYPE AND SCREEN
ABO/RH(D): O POS
Antibody Screen: NEGATIVE

## 2014-01-09 LAB — RPR

## 2014-01-09 LAB — LACTATE DEHYDROGENASE: LDH: 144 U/L (ref 94–250)

## 2014-01-09 LAB — PROTEIN / CREATININE RATIO, URINE
CREATININE, URINE: 24.93 mg/dL
Protein Creatinine Ratio: 0.16 — ABNORMAL HIGH (ref 0.00–0.15)
Total Protein, Urine: 4 mg/dL

## 2014-01-09 LAB — URIC ACID: Uric Acid, Serum: 4.9 mg/dL (ref 2.4–7.0)

## 2014-01-09 MED ORDER — ZOLPIDEM TARTRATE 5 MG PO TABS
5.0000 mg | ORAL_TABLET | Freq: Every evening | ORAL | Status: DC | PRN
  Administered 2014-01-09: 5 mg via ORAL
  Filled 2014-01-09: qty 1

## 2014-01-09 MED ORDER — PANTOPRAZOLE SODIUM 40 MG PO TBEC
40.0000 mg | DELAYED_RELEASE_TABLET | Freq: Every day | ORAL | Status: DC
  Administered 2014-01-09: 40 mg via ORAL
  Filled 2014-01-09: qty 1

## 2014-01-09 NOTE — Progress Notes (Addendum)
  Subjective: Doing well--aware of UCs as cramping, +FM.  Denies HA, visual sx, or epigastric pain.  Did not sleep well due to UCs.  Requests Labetalol be held at present, unless BP elevated--tends to cause BP to drop at times.  Objective: BP 144/78  Pulse 84  Temp(Src) 98 F (36.7 C) (Oral)  Resp 20  Ht 5\' 5"  (1.651 m)  Wt 272 lb (123.378 kg)  BMI 45.26 kg/m2      FHT: Category 1 x 2 UC:   q 3-5 min, mild/moderate SVE:   Deferred at present Received 1 dose Cytotech at 2210, with frequent contractions since then  Assessment:  Twin IUP at 38 2/7 weeks--for induction Gestational hypertension GBS positive Latent phase labor, s/p single dose Cytotech  Plan: Will allow light meal and shower. Check cervix after morning ablutions--determine next step in induction process based on that assessment. Will hold Labetalol at present--will consult with Dr. Su Hiltoberts regarding issue.  Nigel BridgemanLATHAM, November Sypher CNM 01/09/2014, 0730

## 2014-01-09 NOTE — Progress Notes (Signed)
  Subjective: Feels contractions are "different" than earlier today.  Still overall comfortable.  Objective: BP 132/82  Pulse 113  Temp(Src) 98.3 F (36.8 C) (Oral)  Resp 18  Ht 5\' 5"  (1.651 m)  Wt 272 lb (123.378 kg)  BMI 45.26 kg/m2 I/O last 3 completed shifts: In: -  Out: 1000 [Urine:1000] Total I/O In: -  Out: 2000 [Stool:2000] BP 140s-150s/71-89  FHT: Category 1 x 2 UC:   regular, every 3 minutes SVE:   Deferred at present Pitocin at 10 mu/min  Assessment:  Induction for twins, 38 2/[redacted] weeks Gestational hypertension--Labetalol held today per patient request GBS positive  Plan: Per consult with Dr. Su Hiltoberts, will re-evaluate cervix.  If no change, will d/c pitocin and use Cytotech during night. If cervical change, will continue pitocin.  Nigel BridgemanLATHAM, Shekita Boyden CNM 01/09/2014, 7p

## 2014-01-09 NOTE — Progress Notes (Signed)
  Subjective: -Patient resting in bed.  Reports contractions in cervical area, but coping well with them.    Objective: BP 134/46  Pulse 93     FHT: A: 130s bpm, Mod Var, -Decels, +Accels B: 130s bpm, Mod Var, -Decels, +Accels UC:  Every 1-3 min, graphed mild. Unable to palpate  SVE:   Dilation: Closed Effacement (%): 50 Station: -2 Exam by:: J. Emily,CNM Membranes:Intact Pitocin:None  Assessment:  TIUP at 38.2wks Cat I FT  GHTN GBS Positive  Plan: -Unable to place foley catheter as cervix closed -Discussed not inserting additional cytotec due to current contraction pattern -Will observe and start pitocin when appropriate -Continue other mgmt as ordered   Napoleon Monacelli LYNN,CNM, MSN 01/09/2014, 2:20 AM

## 2014-01-09 NOTE — Progress Notes (Signed)
  Subjective: -Patient resting in bed.  Reports contractions are "still there." Continues to cope well.   Objective: BP 143/86  Pulse 77  Temp(Src) 98 F (36.7 C) (Oral)  Resp 20  Ht 5\' 5"  (1.651 m)  Wt 272 lb (123.378 kg)  BMI 45.26 kg/m2     FHT: A:  130s bpm, Mod Var, -Decels, +Accels B: 130s bpm, Mod Var, -Decels, +Accels UC:   Q1-22min, graphs mild, Fundus soft, nt upon palpation SVE:   Dilation: Closed Effacement (%): 50 Station: -2 Exam by:: Sabas SousJ. Landi Biscardi, Sierra Fletcher Membranes: Intact Pitocin: None  Assessment:  TIUP at 38.2wks Cat I FT  GHTN IOL  Plan: -Okay for light, low sodium breakfast this am -Will start pitocin or attempt foley this am depending on contraction pattern and exam -Continue other mgmt as ordered -Report to be given to V.Emilee HeroLatham, Sierra Fletcher  [facility] MiamiEMLY, Sierra Fletcher,CNM, Sierra Fletcher 01/09/2014, 6:35 AM

## 2014-01-09 NOTE — Progress Notes (Signed)
Labor Progress  Subjective: Feeling ctxs: Yes, q5-8 Perception of contractions: mild Vaginal discharge: n/a   Objective: BP 132/82  Pulse 113  Temp(Src) 98.3 F (36.8 C) (Oral)  Resp 18  Ht 5\' 5"  (1.651 m)  Wt 272 lb (123.378 kg)  BMI 45.26 kg/m2   Filed Vitals:   01/09/14 1843 01/09/14 1902 01/09/14 1945 01/09/14 2118  BP: 158/99 147/85 151/72 132/82  Pulse: 105 99 115 113  Temp:   98.3 F (36.8 C)   TempSrc:   Oral   Resp: 20 20 18    Height:      Weight:        I/O last 3 completed shifts: In: -  Out: 1000 [Urine:1000] Total I/O In: -  Out: 1000 [Stool:1000]  FHT: Category 1 x2 CTX:   irregular, every 5-8 minutes Uterus gravid, soft non tender SVE:   Dilation: Closed Effacement (%): 50 Station: -2 Exam by:: Reginold Beale S., cnm Extremities: no significant edema,no calf tenderness and no signs of DVT  Pitocin at 11    Assessment:  IUP at 38 weeks 2 days Membranes:  intact Cervix: FT/50/-2 Labor progress: IOL   Plan: Stop pitocin Hold ABX, to be restart in active labor Con't holding Labetalol Cytotec PV Pt allowed to shower and eat dinner Continuous monitoring CBC CBG q4 in active labor   Sierra Fletcher, CNM, MSN 01/09/2014. 9:43 PM

## 2014-01-09 NOTE — Progress Notes (Signed)
  Subjective: Doing well, not uncomfortable--was feeling contractions until approx 30 min ago, now not feeling any pain.  Objective: BP 144/84  Pulse 115  Temp(Src) 98 F (36.7 C) (Oral)  Resp 20  Ht 5\' 5"  (1.651 m)  Wt 272 lb (123.378 kg)  BMI 45.26 kg/m2   Total I/O In: -  Out: 1000 [Urine:1000]  Filed Vitals:   01/09/14 1232 01/09/14 1302 01/09/14 1332 01/09/14 1405  BP: 152/91 154/83 144/84   Pulse: 101 112 115   Temp:      TempSrc:      Resp: 20 20 20 20   Height:      Weight:         FHT: Category 1 x 2 UC:   regular, every 3-4 minutes SVE: Deferred at present  Pitocin at 5 mu/min  CBC:    Component Value Date/Time   WBC 10.9* 01/09/2014 1140   RBC 4.10 01/09/2014 1140   HGB 12.1 01/09/2014 1140   HCT 36.5 01/09/2014 1140   PLT 229 01/09/2014 1140   MCV 89.0 01/09/2014 1140   MCH 29.5 01/09/2014 1140   MCHC 33.2 01/09/2014 1140   RDW 14.3 01/09/2014 1140   CMP     Component Value Date/Time   NA 134* 01/09/2014 1140   K 4.2 01/09/2014 1140   CL 102 01/09/2014 1140   CO2 20 01/09/2014 1140   GLUCOSE 83 01/09/2014 1140   BUN 6 01/09/2014 1140   CREATININE 0.63 01/09/2014 1140   CALCIUM 9.8 01/09/2014 1140   PROT 6.8 01/09/2014 1140   ALBUMIN 2.7* 01/09/2014 1140   AST 17 01/09/2014 1140   ALT 11 01/09/2014 1140   ALKPHOS 199* 01/09/2014 1140   BILITOT 0.4 01/09/2014 1140   GFRNONAA >90 01/09/2014 1140   GFRAA >90 01/09/2014 1140   Uric acid 4.9 LDH 144  Assessment:  Twin IUP at 38 2/7 weeks--induction Gestational hypertension GBS positive  Plan: Continue current plan of care. Re-evaluate cervix prn. Continue augmentation to improve contraction intensity.  Nigel BridgemanLATHAM, Sierra Fletcher CNM 01/09/2014, 2:10 PM

## 2014-01-09 NOTE — Progress Notes (Signed)
  Subjective: Completed light meal and shower--contractions mildly painful.    Objective: BP 143/72  Pulse 106  Temp(Src) 98 F (36.7 C) (Oral)  Resp 20  Ht 5\' 5"  (1.651 m)  Wt 272 lb (123.378 kg)  BMI 45.26 kg/m2      FHT: Category 1 x 2 UC:   irregular, every 3-6 minutes, mild/moderate SVE:   Dilation: Closed Effacement (%): 50 Station: -2 Exam by:: Loyda Costin  PCR 0.16  Assessment:  Twin IUP at 2538 2/7 weeks Induction Gestational hypertension--holding Labetalol at present GBS positive Latent phase labor  Plan: Reviewed status of cervix with patient and husband. Will start low-dose pitocin to facilitate further cervical ripening. Repeat PIH labs this am and q 6 hours prior to epidural placement. CBG q 4 hours in active labor. Reviewed patient with Dr. Su Hiltoberts.  Sierra Fletcher, Sierra Fletcher CNM 01/09/2014, 11:22 AM

## 2014-01-10 ENCOUNTER — Encounter (HOSPITAL_COMMUNITY): Payer: BC Managed Care – PPO | Admitting: Anesthesiology

## 2014-01-10 ENCOUNTER — Encounter (HOSPITAL_COMMUNITY): Payer: Self-pay

## 2014-01-10 ENCOUNTER — Inpatient Hospital Stay (HOSPITAL_COMMUNITY): Payer: BC Managed Care – PPO | Admitting: Anesthesiology

## 2014-01-10 ENCOUNTER — Encounter (HOSPITAL_COMMUNITY)
Admission: RE | Disposition: A | Discharge status: Home / Self Care | Payer: Self-pay | Source: Ambulatory Visit | Attending: Obstetrics and Gynecology

## 2014-01-10 LAB — CBC
HCT: 37.7 % (ref 36.0–46.0)
Hemoglobin: 12.8 g/dL (ref 12.0–15.0)
MCH: 30 pg (ref 26.0–34.0)
MCHC: 34 g/dL (ref 30.0–36.0)
MCV: 88.5 fL (ref 78.0–100.0)
PLATELETS: 236 10*3/uL (ref 150–400)
RBC: 4.26 MIL/uL (ref 3.87–5.11)
RDW: 14.1 % (ref 11.5–15.5)
WBC: 10.3 10*3/uL (ref 4.0–10.5)

## 2014-01-10 LAB — COMPREHENSIVE METABOLIC PANEL
ALBUMIN: 2.7 g/dL — AB (ref 3.5–5.2)
ALT: 10 U/L (ref 0–35)
AST: 19 U/L (ref 0–37)
Alkaline Phosphatase: 217 U/L — ABNORMAL HIGH (ref 39–117)
Anion gap: 16 — ABNORMAL HIGH (ref 5–15)
BUN: 7 mg/dL (ref 6–23)
CO2: 17 mEq/L — ABNORMAL LOW (ref 19–32)
CREATININE: 0.65 mg/dL (ref 0.50–1.10)
Calcium: 9.8 mg/dL (ref 8.4–10.5)
Chloride: 103 mEq/L (ref 96–112)
GFR calc Af Amer: >90 mL/min (ref >90)
GFR calc non Af Amer: >90 mL/min (ref >90)
Glucose, Bld: 107 mg/dL — ABNORMAL HIGH (ref 70–99)
Potassium: 3.8 mEq/L (ref 3.7–5.3)
Sodium: 136 mEq/L — ABNORMAL LOW (ref 137–147)
TOTAL PROTEIN: 7.1 g/dL (ref 6.0–8.3)
Total Bilirubin: 0.5 mg/dL (ref 0.3–1.2)

## 2014-01-10 LAB — URIC ACID: URIC ACID, SERUM: 5.3 mg/dL (ref 2.4–7.0)

## 2014-01-10 LAB — LACTATE DEHYDROGENASE: LDH: 184 U/L (ref 94–250)

## 2014-01-10 SURGERY — Surgical Case
Anesthesia: Spinal

## 2014-01-10 MED ORDER — PHENYLEPHRINE 8 MG IN D5W 100 ML (0.08MG/ML) PREMIX OPTIME
INJECTION | INTRAVENOUS | Status: DC | PRN
  Administered 2014-01-10: 30 ug/min via INTRAVENOUS

## 2014-01-10 MED ORDER — FENTANYL CITRATE 0.05 MG/ML IJ SOLN
INTRAMUSCULAR | Status: DC | PRN
  Administered 2014-01-10: 12.5 ug via INTRATHECAL

## 2014-01-10 MED ORDER — BUPIVACAINE IN DEXTROSE 0.75-8.25 % IT SOLN
INTRATHECAL | Status: DC | PRN
  Administered 2014-01-10: 1.6 mL via INTRATHECAL

## 2014-01-10 MED ORDER — DIBUCAINE 1 % RE OINT: 1.0000 "application " | TOPICAL_OINTMENT | RECTAL | Status: DC | PRN

## 2014-01-10 MED ORDER — ONDANSETRON HCL 4 MG/2ML IJ SOLN: 4.0000 mg | Freq: Three times a day (TID) | INTRAMUSCULAR | Status: DC | PRN

## 2014-01-10 MED ORDER — ONDANSETRON HCL 4 MG/2ML IJ SOLN
INTRAMUSCULAR | Status: DC | PRN
  Administered 2014-01-10: 4 mg via INTRAVENOUS

## 2014-01-10 MED ORDER — DIPHENHYDRAMINE HCL 25 MG PO CAPS: 25.0000 mg | ORAL_CAPSULE | ORAL | Status: DC | PRN

## 2014-01-10 MED ORDER — FLEET ENEMA 7-19 GM/118ML RE ENEM: 1.0000 | ENEMA | Freq: Every day | RECTAL | Status: DC | PRN

## 2014-01-10 MED ORDER — PANTOPRAZOLE SODIUM 40 MG PO TBEC
40.0000 mg | DELAYED_RELEASE_TABLET | Freq: Every day | ORAL | Status: DC
  Administered 2014-01-11: 40 mg via ORAL
  Filled 2014-01-10 (×2): qty 1

## 2014-01-10 MED ORDER — MENTHOL 3 MG MT LOZG: 1.0000 | LOZENGE | OROMUCOSAL | Status: DC | PRN

## 2014-01-10 MED ORDER — KETOROLAC TROMETHAMINE 30 MG/ML IJ SOLN: 15.0000 mg | Freq: Once | INTRAMUSCULAR | Status: DC | PRN

## 2014-01-10 MED ORDER — SCOPOLAMINE 1 MG/3DAYS TD PT72
1.0000 | MEDICATED_PATCH | Freq: Once | TRANSDERMAL | Status: DC
  Administered 2014-01-10: 1.5 mg via TRANSDERMAL

## 2014-01-10 MED ORDER — KETOROLAC TROMETHAMINE 30 MG/ML IJ SOLN
INTRAMUSCULAR | Status: AC
  Filled 2014-01-10: qty 1

## 2014-01-10 MED ORDER — ONDANSETRON HCL 4 MG PO TABS: 4.0000 mg | ORAL_TABLET | ORAL | Status: DC | PRN

## 2014-01-10 MED ORDER — ZOLPIDEM TARTRATE 5 MG PO TABS: 5.0000 mg | ORAL_TABLET | Freq: Every evening | ORAL | Status: DC | PRN

## 2014-01-10 MED ORDER — FERROUS SULFATE 325 (65 FE) MG PO TABS
325.0000 mg | ORAL_TABLET | Freq: Two times a day (BID) | ORAL | Status: DC
  Administered 2014-01-12: 325 mg via ORAL
  Filled 2014-01-10 (×5): qty 1

## 2014-01-10 MED ORDER — NALBUPHINE HCL 10 MG/ML IJ SOLN: 5.0000 mg | INTRAMUSCULAR | Status: DC | PRN

## 2014-01-10 MED ORDER — DEXTROSE 5 % IV SOLN
1.0000 ug/kg/h | INTRAVENOUS | Status: DC | PRN
  Filled 2014-01-10: qty 2

## 2014-01-10 MED ORDER — LACTATED RINGERS IV SOLN
INTRAVENOUS | Status: DC | PRN
  Administered 2014-01-10 (×2): via INTRAVENOUS

## 2014-01-10 MED ORDER — PHENYLEPHRINE 8 MG IN D5W 100 ML (0.08MG/ML) PREMIX OPTIME
INJECTION | INTRAVENOUS | Status: AC
  Filled 2014-01-10: qty 100

## 2014-01-10 MED ORDER — IBUPROFEN 600 MG PO TABS: 600.0000 mg | ORAL_TABLET | Freq: Four times a day (QID) | ORAL | Status: DC | PRN

## 2014-01-10 MED ORDER — SIMETHICONE 80 MG PO CHEW
80.0000 mg | CHEWABLE_TABLET | ORAL | Status: DC
  Administered 2014-01-11 – 2014-01-13 (×2): 80 mg via ORAL
  Filled 2014-01-10 (×2): qty 1

## 2014-01-10 MED ORDER — MORPHINE SULFATE 0.5 MG/ML IJ SOLN
INTRAMUSCULAR | Status: AC
  Filled 2014-01-10: qty 10

## 2014-01-10 MED ORDER — LACTATED RINGERS IV SOLN
INTRAVENOUS | Status: DC
  Administered 2014-01-11: 06:00:00 via INTRAVENOUS

## 2014-01-10 MED ORDER — SODIUM CHLORIDE 0.9 % IJ SOLN: 3.0000 mL | INTRAMUSCULAR | Status: DC | PRN

## 2014-01-10 MED ORDER — NALOXONE HCL 0.4 MG/ML IJ SOLN: 0.4000 mg | INTRAMUSCULAR | Status: DC | PRN

## 2014-01-10 MED ORDER — OXYTOCIN 10 UNIT/ML IJ SOLN
INTRAMUSCULAR | Status: AC
  Filled 2014-01-10: qty 4

## 2014-01-10 MED ORDER — NALBUPHINE HCL 10 MG/ML IJ SOLN
5.0000 mg | INTRAMUSCULAR | Status: DC | PRN
  Administered 2014-01-11 (×3): 10 mg via SUBCUTANEOUS
  Filled 2014-01-10 (×4): qty 1

## 2014-01-10 MED ORDER — OXYCODONE-ACETAMINOPHEN 5-325 MG PO TABS
1.0000 | ORAL_TABLET | ORAL | Status: DC | PRN
  Administered 2014-01-11 – 2014-01-12 (×3): 2 via ORAL
  Filled 2014-01-10 (×3): qty 2

## 2014-01-10 MED ORDER — PRENATAL MULTIVITAMIN CH
1.0000 | ORAL_TABLET | Freq: Every day | ORAL | Status: DC
Start: 2014-01-11 — End: 2014-01-13
  Administered 2014-01-11 – 2014-01-13 (×3): 1 via ORAL
  Filled 2014-01-10 (×3): qty 1

## 2014-01-10 MED ORDER — ONDANSETRON HCL 4 MG/2ML IJ SOLN
4.0000 mg | INTRAMUSCULAR | Status: DC | PRN
Start: 2014-01-10 — End: 2014-01-13
  Administered 2014-01-10: 4 mg via INTRAVENOUS
  Filled 2014-01-10: qty 2

## 2014-01-10 MED ORDER — KETOROLAC TROMETHAMINE 30 MG/ML IJ SOLN
30.0000 mg | Freq: Four times a day (QID) | INTRAMUSCULAR | Status: AC | PRN
Start: 2014-01-10 — End: 2014-01-11

## 2014-01-10 MED ORDER — SODIUM CHLORIDE 0.9 % IR SOLN
Status: DC | PRN
  Administered 2014-01-10: 1000 mL

## 2014-01-10 MED ORDER — METOCLOPRAMIDE HCL 5 MG/ML IJ SOLN: 10.0000 mg | Freq: Three times a day (TID) | INTRAMUSCULAR | Status: DC | PRN

## 2014-01-10 MED ORDER — DEXTROSE 5 % IV SOLN
3.0000 g | Freq: Once | INTRAVENOUS | Status: AC
  Administered 2014-01-10: 3 g via INTRAVENOUS
  Filled 2014-01-10: qty 3000

## 2014-01-10 MED ORDER — SCOPOLAMINE 1 MG/3DAYS TD PT72
MEDICATED_PATCH | TRANSDERMAL | Status: AC
  Filled 2014-01-10: qty 1

## 2014-01-10 MED ORDER — ONDANSETRON HCL 4 MG/2ML IJ SOLN
INTRAMUSCULAR | Status: AC
  Filled 2014-01-10: qty 2

## 2014-01-10 MED ORDER — WITCH HAZEL-GLYCERIN EX PADS: 1.0000 "application " | MEDICATED_PAD | CUTANEOUS | Status: DC | PRN

## 2014-01-10 MED ORDER — KETOROLAC TROMETHAMINE 30 MG/ML IJ SOLN
30.0000 mg | Freq: Four times a day (QID) | INTRAMUSCULAR | Status: AC | PRN
  Administered 2014-01-10 – 2014-01-11 (×3): 30 mg via INTRAVENOUS
  Filled 2014-01-10 (×2): qty 1

## 2014-01-10 MED ORDER — HYDROMORPHONE HCL PF 1 MG/ML IJ SOLN: 0.2500 mg | INTRAMUSCULAR | Status: DC | PRN

## 2014-01-10 MED ORDER — BISACODYL 10 MG RE SUPP: 10.0000 mg | Freq: Every day | RECTAL | Status: DC | PRN

## 2014-01-10 MED ORDER — MEASLES, MUMPS & RUBELLA VAC ~~LOC~~ INJ
0.5000 mL | INJECTION | Freq: Once | SUBCUTANEOUS | Status: DC
Start: 2014-01-11 — End: 2014-01-11
  Filled 2014-01-10: qty 0.5

## 2014-01-10 MED ORDER — DIPHENHYDRAMINE HCL 50 MG/ML IJ SOLN: 12.5000 mg | INTRAMUSCULAR | Status: DC | PRN

## 2014-01-10 MED ORDER — MORPHINE SULFATE (PF) 0.5 MG/ML IJ SOLN
INTRAMUSCULAR | Status: DC | PRN
  Administered 2014-01-10: .2 mg via INTRATHECAL

## 2014-01-10 MED ORDER — LACTATED RINGERS IV SOLN
40.0000 [IU] | INTRAVENOUS | Status: DC | PRN
  Administered 2014-01-10: 40 [IU] via INTRAVENOUS

## 2014-01-10 MED ORDER — DIPHENHYDRAMINE HCL 50 MG/ML IJ SOLN: 25.0000 mg | INTRAMUSCULAR | Status: DC | PRN

## 2014-01-10 MED ORDER — TETANUS-DIPHTH-ACELL PERTUSSIS 5-2.5-18.5 LF-MCG/0.5 IM SUSP: 0.5000 mL | Freq: Once | INTRAMUSCULAR | Status: DC

## 2014-01-10 MED ORDER — SIMETHICONE 80 MG PO CHEW: 80.0000 mg | CHEWABLE_TABLET | ORAL | Status: DC | PRN

## 2014-01-10 MED ORDER — MEPERIDINE HCL 25 MG/ML IJ SOLN: 6.2500 mg | INTRAMUSCULAR | Status: DC | PRN

## 2014-01-10 MED ORDER — FENTANYL CITRATE 0.05 MG/ML IJ SOLN
INTRAMUSCULAR | Status: AC
  Filled 2014-01-10: qty 2

## 2014-01-10 MED ORDER — SIMETHICONE 80 MG PO CHEW
80.0000 mg | CHEWABLE_TABLET | Freq: Three times a day (TID) | ORAL | Status: DC
  Administered 2014-01-11 – 2014-01-13 (×7): 80 mg via ORAL
  Filled 2014-01-10 (×7): qty 1

## 2014-01-10 MED ORDER — OXYTOCIN 40 UNITS IN LACTATED RINGERS INFUSION - SIMPLE MED: 62.5000 mL/h | INTRAVENOUS | Status: AC

## 2014-01-10 MED ORDER — DIPHENHYDRAMINE HCL 25 MG PO CAPS: 25.0000 mg | ORAL_CAPSULE | Freq: Four times a day (QID) | ORAL | Status: DC | PRN

## 2014-01-10 MED ORDER — PROMETHAZINE HCL 25 MG/ML IJ SOLN: 6.2500 mg | INTRAMUSCULAR | Status: DC | PRN

## 2014-01-10 MED ORDER — PHENYLEPHRINE HCL 10 MG/ML IJ SOLN
INTRAMUSCULAR | Status: DC | PRN
  Administered 2014-01-10: 80 ug via INTRAVENOUS

## 2014-01-10 MED ORDER — CEFAZOLIN SODIUM-DEXTROSE 1-4 GM-% IV SOLR: 2.0000 g | Freq: Once | INTRAVENOUS | Status: DC

## 2014-01-10 MED ORDER — IBUPROFEN 600 MG PO TABS
600.0000 mg | ORAL_TABLET | Freq: Four times a day (QID) | ORAL | Status: DC
  Administered 2014-01-11 – 2014-01-13 (×9): 600 mg via ORAL
  Filled 2014-01-10 (×9): qty 1

## 2014-01-10 MED ORDER — SENNOSIDES-DOCUSATE SODIUM 8.6-50 MG PO TABS
2.0000 | ORAL_TABLET | ORAL | Status: DC
  Administered 2014-01-11 – 2014-01-13 (×2): 2 via ORAL
  Filled 2014-01-10 (×2): qty 2

## 2014-01-10 MED ORDER — LANOLIN HYDROUS EX OINT: 1.0000 "application " | TOPICAL_OINTMENT | CUTANEOUS | Status: DC | PRN

## 2014-01-10 SURGICAL SUPPLY — 42 items
BENZOIN TINCTURE PRP APPL 2/3 (GAUZE/BANDAGES/DRESSINGS) ×3 IMPLANT
BLADE SURG 10 STRL SS (BLADE) ×6 IMPLANT
CLAMP CORD UMBIL (MISCELLANEOUS) IMPLANT
CLOSURE WOUND 1/2 X4 (GAUZE/BANDAGES/DRESSINGS) ×1
CLOTH BEACON ORANGE TIMEOUT ST (SAFETY) ×3 IMPLANT
CONTAINER PREFILL 10% NBF 15ML (MISCELLANEOUS) IMPLANT
DRAIN JACKSON PRT FLT 10 (DRAIN) IMPLANT
DRAIN JACKSON PRT FLT 7MM (DRAIN) ×3 IMPLANT
DRAPE LG THREE QUARTER DISP (DRAPES) IMPLANT
DRSG OPSITE POSTOP 4X10 (GAUZE/BANDAGES/DRESSINGS) ×3 IMPLANT
DURAPREP 26ML APPLICATOR (WOUND CARE) ×3 IMPLANT
ELECT REM PT RETURN 9FT ADLT (ELECTROSURGICAL) ×3
ELECTRODE REM PT RTRN 9FT ADLT (ELECTROSURGICAL) ×1 IMPLANT
EVACUATOR SILICONE 100CC (DRAIN) ×3 IMPLANT
EXTRACTOR VACUUM M CUP 4 TUBE (SUCTIONS) IMPLANT
EXTRACTOR VACUUM M CUP 4' TUBE (SUCTIONS)
GAUZE SPONGE 4X4 16PLY XRAY LF (GAUZE/BANDAGES/DRESSINGS) ×3 IMPLANT
GLOVE BIO SURGEON STRL SZ 6.5 (GLOVE) ×2 IMPLANT
GLOVE BIO SURGEONS STRL SZ 6.5 (GLOVE) ×1
GLOVE BIOGEL PI IND STRL 7.0 (GLOVE) ×1 IMPLANT
GLOVE BIOGEL PI INDICATOR 7.0 (GLOVE) ×2
GOWN STRL REUS W/TWL LRG LVL3 (GOWN DISPOSABLE) ×9 IMPLANT
KIT ABG SYR 3ML LUER SLIP (SYRINGE) IMPLANT
NEEDLE HYPO 25X5/8 SAFETYGLIDE (NEEDLE) IMPLANT
NS IRRIG 1000ML POUR BTL (IV SOLUTION) ×3 IMPLANT
PACK C SECTION WH (CUSTOM PROCEDURE TRAY) ×3 IMPLANT
PAD OB MATERNITY 4.3X12.25 (PERSONAL CARE ITEMS) ×3 IMPLANT
RETRACTOR WND ALEXIS 25 LRG (MISCELLANEOUS) ×1 IMPLANT
RTRCTR C-SECT PINK 25CM LRG (MISCELLANEOUS) IMPLANT
RTRCTR WOUND ALEXIS 25CM LRG (MISCELLANEOUS) ×3
STRIP CLOSURE SKIN 1/2X4 (GAUZE/BANDAGES/DRESSINGS) ×2 IMPLANT
SUT CHROMIC 0 CT 1 (SUTURE) ×3 IMPLANT
SUT MNCRL AB 3-0 PS2 27 (SUTURE) ×3 IMPLANT
SUT PLAIN 2 0 (SUTURE) ×4
SUT PLAIN 2 0 XLH (SUTURE) ×3 IMPLANT
SUT PLAIN ABS 2-0 CT1 27XMFL (SUTURE) ×2 IMPLANT
SUT SILK 2 0 SH (SUTURE) ×3 IMPLANT
SUT VIC AB 0 CTX 36 (SUTURE) ×10
SUT VIC AB 0 CTX36XBRD ANBCTRL (SUTURE) ×5 IMPLANT
TOWEL OR 17X24 6PK STRL BLUE (TOWEL DISPOSABLE) ×3 IMPLANT
TRAY FOLEY CATH 14FR (SET/KITS/TRAYS/PACK) ×3 IMPLANT
WATER STERILE IRR 1000ML POUR (IV SOLUTION) ×3 IMPLANT

## 2014-01-10 NOTE — Progress Notes (Signed)
Addendum  Pt sleeping on her left side  Baby A Category 1 Baby B Category 1 No ctx observed  Eivin Mascio CNM, MSN 01/10/14  0240

## 2014-01-10 NOTE — Progress Notes (Signed)
  Subjective: In rocking chair--aware of contractions.  Had light meal and shower--"feel better after that". Denies HA, visual sx, or epigastric pain.  Objective: BP 153/92  Pulse 94  Temp(Src) 98.3 F (36.8 C) (Oral)  Resp 18  Ht 5\' 5"  (1.651 m)  Wt 272 lb (123.378 kg)  BMI 45.26 kg/m2 I/O last 3 completed shifts: In: -  Out: 5200 [Urine:1000; Stool:4200]   Filed Vitals:   01/10/14 0143 01/10/14 0638 01/10/14 0729 01/10/14 1003  BP: 116/69 143/74 140/76 153/92  Pulse: 116 111 98 94  Temp:      TempSrc:      Resp:   18   Height:      Weight:       CBC    Component Value Date/Time   WBC 10.3 01/10/2014 0720   RBC 4.26 01/10/2014 0720   HGB 12.8 01/10/2014 0720   HCT 37.7 01/10/2014 0720   PLT 236 01/10/2014 0720   MCV 88.5 01/10/2014 0720   MCH 30.0 01/10/2014 0720   MCHC 34.0 01/10/2014 0720   RDW 14.1 01/10/2014 0720   CMP     Component Value Date/Time   NA 136* 01/10/2014 0720   K 3.8 01/10/2014 0720   CL 103 01/10/2014 0720   CO2 17* 01/10/2014 0720   GLUCOSE 107* 01/10/2014 0720   BUN 7 01/10/2014 0720   CREATININE 0.65 01/10/2014 0720   CALCIUM 9.8 01/10/2014 0720   PROT 7.1 01/10/2014 0720   ALBUMIN 2.7* 01/10/2014 0720   AST 19 01/10/2014 0720   ALT 10 01/10/2014 0720   ALKPHOS 217* 01/10/2014 0720   BILITOT 0.5 01/10/2014 0720   GFRNONAA >90 01/10/2014 0720   GFRAA >90 01/10/2014 0720   Uric Acid: 5.3 LDH:  194    FHT: Category 1 x 2 UC:   regular, every 4 minutes SVE:  Deferred   Pitocin at 3 mu/min  Assessment:  Twin gestation, serial induction GBS positive Gestational hypertension  Plan: Continue pitocin induction. Labetalol po held at present per patient request--will monitor BP. CBGs q 4 hr with onset of active labor.  Nigel BridgemanLATHAM, Wilkes Potvin CNM 01/10/2014, 10:06 AM

## 2014-01-10 NOTE — Progress Notes (Signed)
Labor Progress  Subjective: Pt sleeping thru ctx  Objective: BP 116/69  Pulse 116  Temp(Src) 98.3 F (36.8 C) (Oral)  Resp 18  Ht 5\' 5"  (1.651 m)  Wt 272 lb (123.378 kg)  BMI 45.26 kg/m2 I/O last 3 completed shifts: In: -  Out: 1000 [Urine:1000] Total I/O In: -  Out: 2000 [Stool:2000]  FHT: Category 1 x2 CTX:   regular, every 3-4 minutes, moderate Uterus gravid, soft non tender SVE:   Dilation: Closed Effacement (%): 50 Station: -2 Exam by:: Geryl Dohn S., cnm Extremities: no significant edema,no calf tenderness and no signs of DVT     Assessment:  IUP at 38 weeks 3 days Membranes: intact Cervical check deferred Labor progress: early   Plan: Continue labor plan Rest Continuous monitoring   Regla Fitzgibbon, CNM, MSN 01/10/2014. 4:42 AM

## 2014-01-10 NOTE — Progress Notes (Addendum)
Addendum A/P G1 P0 Di/Di twins at 38.3 weeks Pt awake, report feeling the ctx like a "period cramp" SVE not much change from previous exam. Cervix is softer  FHRs run close, difficult tracing while pt was sleeping on her side Category 1 for both A & B Restart pitocin Dr Su Hiltoberts consulted.  Sign off to Nigel BridgemanVicki Latham CNM    Cobi Delph CNM, MSN January 10, 2014 [id number]

## 2014-01-10 NOTE — Progress Notes (Signed)
Pt desires to have a cesarea section.  She is aware of the risks and benefits.  She no longer desires to move forward with induction for vaginal delivery.  She understands the risks are but not limited to bleeding, infection, damage to internal organs.

## 2014-01-10 NOTE — Progress Notes (Signed)
  Subjective: Unaware of contractions now.  Dr. Normand Sloopillard in to see patient in last hour--discussed options for care.  Patient had initially planned to continue induction until tomorrow pm, but now wishes to be re-evaluated at 4pm.  If no progress, she requests delivery by C/S.  Objective: BP 151/72  Pulse 93  Temp(Src) 98.2 F (36.8 C) (Oral)  Resp 18  Ht 5\' 5"  (1.651 m)  Wt 272 lb (123.378 kg)  BMI 45.26 kg/m2 I/O last 3 completed shifts: In: -  Out: 5200 [Urine:1000; Stool:4200] Total I/O In: -  Out: 2000 [Urine:2000]  Filed Vitals:   01/10/14 1238 01/10/14 1310 01/10/14 1346 01/10/14 1348  BP: 150/89 139/62 149/100 151/72  Pulse: 94 99 110 93  Temp:      TempSrc:      Resp: 18  18   Height:      Weight:         FHT: Category 1 x 2 UC:   Irregular, mild. SVE:  Deferred at present. Pitocin at 9 mu/min  Assessment:  Induction for twins, gestational hypertension Unfavorable cervix  Plan: Consulted with Dr. Sondra Comeillard--OK to assess patient at 4pm.  If no progress, will plan C/S. If progress has occurred, will proceed with induction plan.  Kizer Nobbe CNM 01/10/2014, 1:50 PM

## 2014-01-10 NOTE — Progress Notes (Signed)
  Subjective: Comfortable, unaware of UCs.  Had requested VE for assessment, with plan to elect C/S if no cervical change.  Objective: BP 159/89  Pulse 101  Temp(Src) 98 F (36.7 C) (Oral)  Resp 20  Ht 5\' 5"  (1.651 m)  Wt 272 lb (123.378 kg)  BMI 45.26 kg/m2 I/O last 3 completed shifts: In: -  Out: 5200 [Urine:1000; Stool:4200] Total I/O In: -  Out: 2000 [Urine:2000]  Filed Vitals:   01/10/14 1440 01/10/14 1450 01/10/14 1526 01/10/14 1603  BP:  156/86 150/84 159/89  Pulse:  100 95 101  Temp:    98 F (36.7 C)  TempSrc:    Oral  Resp: 18  18 20   Height:      Weight:       No labetalol since Friday, per patient preference.   FHT: Category 1 x 2 UC:   regular, every 3 minutes SVE:   Dilation: Fingertip Effacement (%): Thick Station: -2 Exam by:: Emilee HeroLatham Pitocin at 11/mu/min  Assessment:  Induction for twins, gestational hypertension Unfavorable cervix, no cervical change after 2 days of Cytotech and pitocin GBS positive Gestational hypertension  Plan: Reviewed lack of cervical change with patient and husband--they wish to proceed with C/S. Dr. Doreene Elandillard consulted--she will contact OR for plan for timing.  Patient's last meal at 9am. R&B of C/S reviewed with patient and husband, including bleeding, infection, and damage to other organs.  Patient and husband seem to understand these risks and wish to proceed. Will consult with Anesthesia regarding any need for repeat CBC (last drawn 0740).  Nigel BridgemanLATHAM, Roshini Fulwider CNM 01/10/2014, 4:49 PM

## 2014-01-10 NOTE — Progress Notes (Addendum)
Labor Progress  Subjective: Pt sleeping   Objective: BP 133/65  Pulse 104  Temp(Src) 98.3 F (36.8 C) (Oral)  Resp 18  Ht 5\' 5"  (1.651 m)  Wt 272 lb (123.378 kg)  BMI 45.26 kg/m2 I/O last 3 completed shifts: In: -  Out: 1000 [Urine:1000] Total I/O In: -  Out: 2000 [Stool:2000]  FHT: Category 1 x2 CTX:   irregular Uterus gravid, soft non tender SVE:   Dilation: Closed Effacement (%): 50 Station: -2 Exam by:: Zulema Pulaski S., cnm Bishop score 1 Extremities: no significant edema,no calf tenderness and no signs of DVT     Assessment:  IUP at 38 weeks 3 days with Di/Di twins Membranes: intact Cervix: FT/50/-2 Labor progress: early labor Bishop Score 1   Plan: Continue labor plan Rest Continuous monitoring Cytotec   Soumya Colson, CNM, MSN 01/10/2014. 1:59 AM

## 2014-01-10 NOTE — Transfer of Care (Signed)
Immediate Anesthesia Transfer of Care Note  Patient: Sierra ClickCarmen Ecklund  Procedure(s) Performed: Procedure(s): CESAREAN SECTION (N/A)  Patient Location: PACU  Anesthesia Type:Spinal  Level of Consciousness: awake  Airway & Oxygen Therapy: Patient Spontanous Breathing  Post-op Assessment: Report given to PACU RN and Post -op Vital signs reviewed and stable  Post vital signs: stable  Complications: No apparent anesthesia complications

## 2014-01-10 NOTE — Anesthesia Procedure Notes (Signed)
Spinal  Patient location during procedure: OR Start time: 01/10/2014 5:37 PM End time: 01/10/2014 5:41 PM Staffing Anesthesiologist: Leilani AbleHATCHETT, Latiya Navia Performed by: anesthesiologist  Preanesthetic Checklist Completed: patient identified, surgical consent, pre-op evaluation, timeout performed, IV checked, risks and benefits discussed and monitors and equipment checked Spinal Block Patient position: sitting Prep: site prepped and draped and DuraPrep Patient monitoring: heart rate, cardiac monitor, continuous pulse ox and blood pressure Approach: midline Location: L3-4 Injection technique: single-shot Needle Needle type: Pencan  Needle gauge: 24 G Needle length: 9 cm Needle insertion depth: 8 cm Assessment Sensory level: T4

## 2014-01-10 NOTE — Anesthesia Preprocedure Evaluation (Signed)
Anesthesia Evaluation  Patient identified by MRN, date of birth, ID band Patient awake    Reviewed: Allergy & Precautions, H&P , NPO status , Patient's Chart, lab work & pertinent test results  Airway Mallampati: III TM Distance: >3 FB Neck ROM: full    Dental no notable dental hx.    Pulmonary neg pulmonary ROS,  breath sounds clear to auscultation  Pulmonary exam normal       Cardiovascular hypertension, Pt. on medications     Neuro/Psych negative neurological ROS  negative psych ROS   GI/Hepatic negative GI ROS, Neg liver ROS,   Endo/Other  Morbid obesity  Renal/GU negative Renal ROS     Musculoskeletal   Abdominal (+) + obese,   Peds  Hematology   Anesthesia Other Findings   Reproductive/Obstetrics (+) Pregnancy                           Anesthesia Physical Anesthesia Plan  ASA: III  Anesthesia Plan: Spinal   Post-op Pain Management:    Induction:   Airway Management Planned:   Additional Equipment:   Intra-op Plan:   Post-operative Plan:   Informed Consent: I have reviewed the patients History and Physical, chart, labs and discussed the procedure including the risks, benefits and alternatives for the proposed anesthesia with the patient or authorized representative who has indicated his/her understanding and acceptance.     Plan Discussed with: CRNA and Surgeon  Anesthesia Plan Comments:         Anesthesia Quick Evaluation

## 2014-01-10 NOTE — Op Note (Signed)
Sierra Fletcher   Sierra ClickCarmen Rambo  01/08/2014 - 01/10/2014  Indications: Twins, Failed induction Pt desires Sierra section   Pre-operative Diagnosis: Sierra section, failure to progress multiple gestation.   Post-operative Diagnosis: Same   Surgeon: Surgeon(s) and Role:    * Michael LitterNaima A Devynn Scheff, MD - Primary   Assistants: Manfred ArchV. Latham CNM   Anesthesia: spinal   Procedure Details:  The patient was seen in the Holding Room. The risks, benefits, complications, treatment options, and expected outcomes were discussed with the patient. The patient concurred with the proposed plan, giving informed consent. identified as Sierra Fletcher and the procedure verified as C-Section Delivery. A Time Out was held and the above information confirmed.  After induction of anesthesia, the patient was draped and prepped in the usual sterile manner. A transverse incision was made and carried down through the subcutaneous tissue to the fascia. Fascial incision was made in the midline and extended transversely. The fascia was separated from the underlying rectus muscle superiorly and inferiorly. The peritoneum was identified and entered. Peritoneal incision was extended longitudinally with good visualization of bowel and bladder. The utero-vesical peritoneal reflection was incised transversely and the bladder flap was bluntly freed from the lower uterine segment.  An alexsis retractor was placed in the abdomen.   A low transverse uterine incision was made. Delivered from cephalic presentation was a  infant, with Apgar scores of 8 at one minute and 9 at five minutes.  Baby B sac was ruptured and the baby was delivered without difficulty.   Cord ph was on baby B the umbilical cord was clamped and cut cord blood was obtained for evaluation. The placenta was removed Intact and appeared normal. The uterine outline, tubes and ovaries appeared normal}. The uterine incision was closed with running locked sutures of  0Vicryl. A second layer 0 vicrlyl was used to imbricate the uterine incision    Hemostasis was observed. Lavage was carried out until clear. The alexsis was removed.  The peritoneum was closed with 0 chromic.  The muscles were examined and any bleeders were made hemostatic using bovie cautery device.   The fascia was then reapproximated with running sutures of 0 vicryl.  The subcutaneous tissue was reapproximated  With interrupted stitches using 2-0 plain gut. The subcuticular closure was performed using 3-600monocryl     Instrument, sponge, and needle counts were correct prior the abdominal closure and were correct at the conclusion of the case.    Findings: infant was delivered from vtx/vtx presentation. The fluid was clear on both infants.  The uterus tubes and ovaries appeared normal.     Estimated Blood Loss: 800ml   Total IV Fluids: 3000ml   Urine Output: 150CC OF clear urine  Specimens: placentas  Complications: no complications  Disposition: PACU - hemodynamically stable.   Maternal Condition: stable   Baby condition / location:  Couplet care / Skin to Skin  Attending Attestation: I performed the procedure.   Signed: Surgeon(s): Michael LitterNaima A Tahtiana Rozier, MD

## 2014-01-10 NOTE — Progress Notes (Signed)
  Subjective: Slept well--requests light meal and shower before restarting pitocin.  Objective: BP 143/74  Pulse 111  Temp(Src) 98.3 F (36.8 C) (Oral)  Resp 18  Ht 5\' 5"  (1.651 m)  Wt 272 lb (123.378 kg)  BMI 45.26 kg/m2 I/O last 3 completed shifts: In: -  Out: 5200 [Urine:1000; Stool:4200]    FHT: Category 1 x 2 UC:   irregular, every 4-6 minutes, mild. SVE:   Dilation: Fingertip Effacement (%): Thick Station: -2 Exam by:: Venus S. cnm at 7am   Assessment:  Twin IUP at 938 3/[redacted] weeks Gestational hypertension--holding Labetalol at present per patient request. GBS positive  Plan: Restart pitocin per low-dose protocol. Repeat PIH labs Reviewed plan of care with patient and husband.  Nigel BridgemanLATHAM, Daron Breeding CNM 01/10/2014, 8:14 AM

## 2014-01-11 ENCOUNTER — Encounter (HOSPITAL_COMMUNITY): Payer: Self-pay | Admitting: Obstetrics and Gynecology

## 2014-01-11 LAB — CBC
HCT: 32.1 % — ABNORMAL LOW (ref 36.0–46.0)
Hemoglobin: 10.8 g/dL — ABNORMAL LOW (ref 12.0–15.0)
MCH: 29.7 pg (ref 26.0–34.0)
MCHC: 33.6 g/dL (ref 30.0–36.0)
MCV: 88.2 fL (ref 78.0–100.0)
PLATELETS: 222 10*3/uL (ref 150–400)
RBC: 3.64 MIL/uL — ABNORMAL LOW (ref 3.87–5.11)
RDW: 14.1 % (ref 11.5–15.5)
WBC: 12.6 10*3/uL — ABNORMAL HIGH (ref 4.0–10.5)

## 2014-01-11 MED ORDER — FERROUS SULFATE 325 (65 FE) MG PO TABS
325.0000 mg | ORAL_TABLET | Freq: Two times a day (BID) | ORAL | Status: DC
  Administered 2014-01-11 – 2014-01-13 (×4): 325 mg via ORAL

## 2014-01-11 NOTE — Progress Notes (Addendum)
Subjective: Postpartum Day 1: Cesarean Delivery due to failed IOL of Di/DI twin buys Patient up ad lib, reports no syncope or dizziness.   S/O slight numbness on the outter side of her right leg, anesthesia aware Feeding:  Breast with supplementation of formula Contraceptive plan:  unsure  Objective: Vital signs in last 24 hours: Temp:  [97.7 F (36.5 C)-98.8 F (37.1 C)] 98.7 F (37.1 C) (08/03 0635) Pulse Rate:  [68-121] 121 (08/03 0650) Resp:  [10-24] 20 (08/03 0635) BP: (94-160)/(50-100) 115/69 mmHg (08/03 0650) SpO2:  [95 %-100 %] 96 % (08/03 [id number])  Filed Vitals:   01/11/14 0440 01/11/14 0635 01/11/14 0647 01/11/14 0650  BP:  125/52 124/79 115/69  Pulse: 92 87 95 121  Temp:  98.7 F (37.1 C)    TempSrc:  Oral    Resp: 18 20    Height:      Weight:      SpO2: 95% 96%      Physical Exam:  General: alert and cooperative Lochia: appropriate Uterine Fundus: firm Abdomen:  + bowel sounds, non distended Incision: no significant drainage  Honeycomb dressing CDI DVT Evaluation: No evidence of DVT seen on physical exam. Homan's sign: Negative JP drain:   yes   Recent Labs  01/09/14 1140 01/10/14 0720 01/11/14 0615  HGB 12.1 12.8 10.8*  HCT 36.5 37.7 32.1*  WBC 10.9* 10.3 12.6*    Assessment/Plan: Status post Cesarean section day 1 Doing well postoperatively.  Continue current care. Circumcision prior to discharge Diet advanced as tolerated Iron supplements BID  Jackey Housey, CNM, MSN 01/11/2014. 9:38 AM

## 2014-01-11 NOTE — Progress Notes (Signed)
Subjective: Postpartum Day 1: Cesarean Delivery Patient reports numbness in right thigh area since delivery.  Has been able to ambulate though without difficulty.  Tolerating liquid POs.  Abdominal pain well controlled.  Scant lochia.  Has not voided yet.  Breastfeeding and pumping breast milk, twin B has difficulty with breastfeeding and for this reason she desires to defer circumcision of her twins at this point          Objective: Vital signs in last 24 hours: Temp:  [97.7 F (36.5 C)-99 F (37.2 C)] 99 F (37.2 C) (08/03 1040) Pulse Rate:  [67-121] 67 (08/03 1040) Resp:  [10-24] 20 (08/03 1040) BP: (94-159)/(50-100) 122/62 mmHg (08/03 1040) SpO2:  [95 %-100 %] 97 % (08/03 1040)  Physical Exam:  General: alert and no distress Lochia: appropriate Uterine Fundus: firm Incision: healing well.  JP drain with 15 cc serosangenous drainage.   DVT Evaluation: No evidence of DVT seen on physical exam. 2+ edema BL in lower extremities.     Recent Labs  01/10/14 0720 01/11/14 0615  HGB 12.8 10.8*  HCT 37.7 32.1*    Assessment/Plan: Status post Cesarean section. Doing well postoperatively.  Continue current care. -D/f foley as per protocol. -Circumcision of neonates tomorrow.   [facility] HospitalKULWA,Suetta [facility]WAKURU 01/11/2014, 11:35 AM

## 2014-01-11 NOTE — Lactation Note (Signed)
This note was copied from the chart of Sierra Willetta Tworek. Lactation Consultation Note New mom w/twins, hasn't been able to latch well, keeps falling off. Lg. Pendulum breast, elevated w/cloth, has very soft breast tissue, noted wide space between breast, but has notable breast tissue. Discussed w/mom post-pumping after feedings d/t PCOS to encourage adequate milk supply in the future. Discussed possiblility of needing to supplement. Baby's latches but will not hold latch very long. #20 NS applied and demonstrated application and care. Instructed to use "C" hold w/ latching of NS d/t breast tissue so soft. Mom wanted to dual feed, but encouraged until she learns and baby's learn to feed properly and using NS she will need both hands on one baby. Mom shown how to use DEBP & how to disassemble, clean, & reassemble parts.Reviewed Baby & Me book's Breastfeeding Basics. Mom encouraged to feed baby 8-12 times/24 hours and with feeding cues. Encouraged to call for assistance if needed and to verify proper latch.Hand expression taught to Mom. WH/LC brochure given w/resources, support groups and LC services.Referred to Baby and Me Book in Breastfeeding section Pg. 22-23 for position options and Proper latch demonstration. Dad at bedside and supportive. Patient Name: Sierra Fletcher Today's Date: 01/11/2014 Reason for consult: Initial assessment   Maternal Data Has patient been taught Hand Expression?: Yes Does the patient have breastfeeding experience prior to this delivery?: No  Feeding Feeding Type: Breast Fed Length of feed: 7 min  LATCH Score/Interventions Latch: Repeated attempts needed to sustain latch, nipple held in mouth throughout feeding, stimulation needed to elicit sucking reflex. Intervention(s): Adjust position;Assist with latch;Breast massage;Breast compression  Audible Swallowing: A few with stimulation Intervention(s): Skin to skin;Hand expression  Type of Nipple: Everted at rest and  after stimulation (no shaft to nipple, compresses well)  Comfort (Breast/Nipple): Soft / non-tender     Hold (Positioning): Assistance needed to correctly position infant at breast and maintain latch. Intervention(s): Breastfeeding basics reviewed;Support Pillows;Position options;Skin to skin  LATCH Score: 7  Lactation Tools Discussed/Used Tools: Shells;Nipple Shields;Pump Nipple shield size: 20 Shell Type: Inverted Breast pump type: Double-Electric Breast Pump Pump Review: Setup, frequency, and cleaning;Milk Storage Initiated by:: L. Sherman Donaldson RN Date initiated:: 01/11/14   Consult Status Consult Status: Follow-up Date: 01/11/14 Follow-up type: In-patient    Sierra Fletcher 01/11/2014, 2:32 AM    

## 2014-01-11 NOTE — Lactation Note (Signed)
This note was copied from the chart of Sierra Jolaine ClickCarmen Veazie. Lactation Consultation Note  Follow up visit at 23 hours of age.  Mom is feeding baby A on her left breast for a total of 15 minutes.  Mom reports she started on her right, but she doesn't see colostrum so switched baby to right.  Encouraged mom to allow baby to feed for whole feeding on one breast.  FOB is offering syringe supplementation without finger feeding, so instructions and demonstration of finger feeding with curve tipped syringe was done.  FOB is able to return demonstration.  Baby B is showing feeding cues.  Mom reports she is using a nipple shield for him during feedings, but he is not doing well yet.  Mom has semi flat nipple with short shaft.  Mom is able to demonstrate proper nipple shield application after instructions given for reinforcement.  Attempted in football hold on left breast, baby held nipple in mouth, but was not sucking.  Several attempts in cross cradle hold and "tea cup" hold used, then nipple shield removed before baby finally latched on well with wide flanged lips and rhythmic sucking.  Baby maintained feeding for 10 minutes and then came off the breast.  FOB supplemented with 6ml formula in syringe/ finger feeding.  Baby tolerated well and mom started to use DEBP.  She used it once about 6 hours ago.  Encouraged breast feeding and supplementation to be limited to 30 minutes and to post pump on preemie setting about every 3 hours.  FOB supportive and mom seems motivated to continue plan. Mom may be a candidate for SNS later, but at this time needs to focus on positioning and latching practice.  Mom to call for assist as needed.    Patient Name: Sierra BaizeBoyB Emmry Quattrone GEXBM'WToday's Date: 01/11/2014 Reason for consult: Follow-up assessment;Infant < 6lbs   Maternal Data    Feeding Feeding Type: Formula Length of feed: 15 min  LATCH Score/Interventions Latch: Repeated attempts needed to sustain latch, nipple held in mouth  throughout feeding, stimulation needed to elicit sucking reflex. Intervention(s): Skin to skin;Waking techniques Intervention(s): Adjust position;Assist with latch;Breast massage;Breast compression  Audible Swallowing: A few with stimulation Intervention(s): Skin to skin;Hand expression  Type of Nipple: Flat  Comfort (Breast/Nipple): Soft / non-tender     Hold (Positioning): Assistance needed to correctly position infant at breast and maintain latch. Intervention(s): Breastfeeding basics reviewed;Support Pillows;Position options;Skin to skin  LATCH Score: 6  Lactation Tools Discussed/Used Tools: Other (comment);Nipple Shields (syringe) Nipple shield size: 20 Breast pump type: Double-Electric Breast Pump   Consult Status Consult Status: Follow-up Date: 01/12/14 Follow-up type: In-patient    Sierra Fletcher, Sierra Fletcher 01/11/2014, 5:42 PM

## 2014-01-11 NOTE — Anesthesia Postprocedure Evaluation (Signed)
  Anesthesia Post-op Note  Patient: Sierra Fletcher  Procedure(s) Performed: Procedure(s): CESAREAN SECTION (N/A)  Patient Location: Mother/Baby  Anesthesia Type:Epidural  Level of Consciousness: awake, alert  and oriented  Airway and Oxygen Therapy: Patient Spontanous Breathing  Post-op Pain: none  Post-op Assessment: Post-op Vital signs reviewed, Patient's Cardiovascular Status Stable, Respiratory Function Stable, No headache, No backache, No residual numbness and No residual motor weakness  Post-op Vital Signs: Reviewed and stable  Last Vitals:  Filed Vitals:   01/11/14 0650  BP: 115/69  Pulse: 121  Temp:   Resp:     Complications: No apparent anesthesia complications

## 2014-01-12 MED ORDER — OXYCODONE-ACETAMINOPHEN 5-325 MG PO TABS: 1.0000 | ORAL_TABLET | ORAL | Status: DC | PRN

## 2014-01-12 MED ORDER — IBUPROFEN 600 MG PO TABS: 600.0000 mg | ORAL_TABLET | Freq: Four times a day (QID) | ORAL | Status: DC | PRN

## 2014-01-12 MED ORDER — FERROUS SULFATE 325 (65 FE) MG PO TABS: 325.0000 mg | ORAL_TABLET | Freq: Two times a day (BID) | ORAL | Status: DC

## 2014-01-12 MED ORDER — PRENATAL MULTIVITAMIN CH
1.0000 | ORAL_TABLET | Freq: Every day | ORAL | Status: DC
Start: 2014-01-13 — End: 2019-08-24

## 2014-01-12 NOTE — Discharge Summary (Signed)
Cesarean Section Delivery Discharge Summary  Sierra Fletcher  DOB:    27-Jul-1980 MRN:    [id number] CSN:    [id number]  Date of admission:                  01/08/14  Date of discharge:                   01/13/14  Procedures this admission:  Primary cs of Di/Di twins  Date of Delivery: 12/10/13  Newborn Data:    Shynice, Sigel [[id number]]  Live born female  Birth Weight: 5 lb 15.4 oz (2705 g) APGAR: 8, [address]   Janvi, Ammar [[id number]]  Live born female  Birth Weight: 5 lb 4.7 oz (2400 g) APGAR: 8, 8  Home with mother. Name: Jacolyn Reedy, Reubin Milan Circumcision Plan: in patient  History of Present Illness:  Ms. Sierra Fletcher is a 33 y.o. female, G1P1002, who presents at [redacted]w[redacted]d weeks gestation. The patient has been followed at the [facility] division of [facility].    Her pregnancy has been complicated by:  Patient Active Problem List   Diagnosis Date Noted  . Twin gestation, dichorionic diamniotic 01/08/2014  . Positive GBS test 01/08/2014  . Anemia 01/08/2014  . 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 FH: breast cancer in first degree relative when <63 years old--mother age 42 01/08/2014  . History of PCOS--on Metformin 01/08/2014  . Heart palpitations--negative w/u 09/25/2013    Hospital course:  The patient was admitted for IOL.   Her postpartum course was not complicated. She was discharged to home on postpartum day 3 doing well.  Feeding:  breast and bottle  Contraception:  unsure  Discharge hemoglobin:  Hemoglobin  Date Value Ref Range Status  01/11/2014 10.8* 12.0 - 15.0 g/dL Final     HCT  Date Value Ref Range Status  01/11/2014 32.1* 36.0 - 46.0 % Final    Discharge Physical Exam:   General: alert and cooperative Lochia: appropriate Uterine Fundus: firm Incision: healing well, no significant drainage DVT Evaluation: No evidence of DVT seen on physical  exam.  Intrapartum Procedures: cesarean: low cervical, transverse Postpartum Procedures: none Complications-Operative and Postpartum: none  Discharge Diagnoses: Term Pregnancy-delivered and Failed induction  Discharge Information: [facility] nurse to visit next week  Activity:           pelvic rest Diet:                routine Medications: PNV, Ibuprofen, Iron and Percocet Condition:      stable   Discharge to: home  Follow-up Information   Follow up with [facility]. Schedule an appointment as soon as possible for a visit in 6 weeks. (Call with any questions or concerns)    Specialty:  Obstetrics and Gynecology   Contact information:   [address] [place] [address] [phone]       Theodis Kinsel, CNM, MSN  01/12/2014. 9:40 PM  Care After Cesarean Delivery  Refer to this sheet in the next few weeks. These instructions provide you with information on caring for yourself after your procedure. Your caregiver may also give you specific instructions. Your treatment has been planned according to current medical practices, but problems sometimes occur. Call your caregiver if you have any problems or questions after you go home. HOME CARE INSTRUCTIONS  Only take over-the-counter or prescription medicines as directed by your caregiver.  Do not drink alcohol, especially if you are breastfeeding or taking medicine to relieve pain.  Do not chew or smoke tobacco.  Continue to use good perineal care. Good perineal care includes:  Wiping your perineum from front to back.  Keeping your perineum clean.  Check your cut (incision) daily for increased redness, drainage, swelling, or separation of skin.  Clean your incision gently with soap and water every day, and then pat it dry. If your caregiver says it is okay, leave the incision uncovered. Use a bandage (dressing) if the incision is draining fluid or appears irritated. If  the adhesive strips across the incision do not fall off within 7 days, carefully peel them off.  Hug a pillow when coughing or sneezing until your incision is healed. This helps to relieve pain.  Do not use tampons or douche until your caregiver says it is okay.  Shower, wash your hair, and take tub baths as directed by your caregiver.  Wear a well-fitting bra that provides breast support.  Limit wearing support panties or control-top hose.  Drink enough fluids to keep your urine clear or pale yellow.  Eat high-fiber foods such as whole grain cereals and breads, brown rice, beans, and fresh fruits and vegetables every day. These foods may help prevent or relieve constipation.  Resume activities such as climbing stairs, driving, lifting, exercising, or traveling as directed by your caregiver.  Talk to your caregiver about resuming sexual activities. This is dependent upon your risk of infection, your rate of healing, and your comfort and desire to resume sexual activity.  Try to have someone help you with your household activities and your newborn for at least a few days after you leave the hospital.  Rest as much as possible. Try to rest or take a nap when your newborn is sleeping.  Increase your activities gradually.  Keep all of your scheduled postpartum appointments. It is very important to keep your scheduled follow-up appointments. At these appointments, your caregiver will be checking to make sure that you are healing physically and emotionally. SEEK MEDICAL CARE IF:   You are passing large clots from your vagina. Save any clots to show your caregiver.  You have a foul smelling discharge from your vagina.  You have trouble urinating.  You are urinating frequently.  You have pain when you urinate.  You have a change in your bowel movements.  You have increasing redness, pain, or swelling near your incision.  You have pus draining from your incision.  Your incision is  separating.  You have painful, hard, or reddened breasts.  You have a severe headache.  You have blurred vision or see spots.  You feel sad or depressed.  You have thoughts of hurting yourself or your newborn.  You have questions about your care, the care of your newborn, or medicines.  You are dizzy or lightheaded.  You have a rash.  You have pain, redness, or swelling at the site of the removed intravenous access (IV) tube.  You have nausea or vomiting.  You stopped breastfeeding and have not had a menstrual period within 12 weeks of stopping.  You are not breastfeeding and have not had a menstrual period within 12 weeks of delivery.  You have a fever. SEEK IMMEDIATE MEDICAL CARE IF:  You have persistent pain.  You have chest pain.  You have shortness of breath.  You faint.  You have leg pain.  You have stomach pain.  Your vaginal bleeding saturates  2 or more sanitary pads in 1 hour. MAKE SURE YOU:   Understand these instructions.  Will watch your condition.  Will get help right away if you are not doing well or get worse. Document Released: 02/17/2002 Document Revised: 02/20/2012 Document Reviewed: 01/23/2012 [facility] Patient Information 2014 [place].   Postpartum Depression and Baby Blues  The postpartum period begins right after the birth of a baby. During this time, there is often a great amount of joy and excitement. It is also a time of considerable changes in the life of the parent(s). Regardless of how many times a mother gives birth, each child brings new challenges and dynamics to the family. It is not unusual to have feelings of excitement accompanied by confusing shifts in moods, emotions, and thoughts. All mothers are at risk of developing postpartum depression or the "baby blues." These mood changes can occur right after giving birth, or they may occur many months after giving birth. The baby blues or postpartum depression can be mild or  severe. Additionally, postpartum depression can resolve rather quickly, or it can be a long-term condition. CAUSES Elevated hormones and their rapid decline are thought to be a main cause of postpartum depression and the baby blues. There are a number of hormones that radically change during and after pregnancy. Estrogen and progesterone usually decrease immediately after delivering your baby. The level of thyroid hormone and various cortisol steroids also rapidly drop. Other factors that play a major role in these changes include major life events and genetics.  RISK FACTORS If you have any of the following risks for the baby blues or postpartum depression, know what symptoms to watch out for during the postpartum period. Risk factors that may increase the likelihood of getting the baby blues or postpartum depression include:  Havinga personal or family history of depression.  Having depression while being pregnant.  Having premenstrual or oral contraceptive-associated mood issues.  Having exceptional life stress.  Having marital conflict.  Lacking a social support network.  Having a baby with special needs.  Having health problems such as diabetes. SYMPTOMS Baby blues symptoms include:  Brief fluctuations in mood, such as going from extreme happiness to sadness.  Decreased concentration.  Difficulty sleeping.  Crying spells, tearfulness.  Irritability.  Anxiety. Postpartum depression symptoms typically begin within the first month after giving birth. These symptoms include:  Difficulty sleeping or excessive sleepiness.  Marked weight loss.  Agitation.  Feelings of worthlessness.  Lack of interest in activity or food. Postpartum psychosis is a very concerning condition and can be dangerous. Fortunately, it is rare. Displaying any of the following symptoms is cause for immediate medical attention. Postpartum psychosis symptoms include:  Hallucinations and  delusions.  Bizarre or disorganized behavior.  Confusion or disorientation. DIAGNOSIS  A diagnosis is made by an evaluation of your symptoms. There are no medical or lab tests that lead to a diagnosis, but there are various questionnaires that a caregiver may use to identify those with the baby blues, postpartum depression, or psychosis. Often times, a screening tool called the [place] Postnatal Depression Scale is used to diagnose depression in the postpartum period.  TREATMENT The baby blues usually goes away on its own in 1 to 2 weeks. Social support is often all that is needed. You should be encouraged to get adequate sleep and rest. Occasionally, you may be given medicines to help you sleep.  Postpartum depression requires treatment as it can last several months or longer if it is  not treated. Treatment may include individual or group therapy, medicine, or both to address any social, physiological, and psychological factors that may play a role in the depression. Regular exercise, a healthy diet, rest, and social support may also be strongly recommended.  Postpartum psychosis is more serious and needs treatment right away. Hospitalization is often needed. HOME CARE INSTRUCTIONS  Get as much rest as you can. Nap when the baby sleeps.  Exercise regularly. Some women find yoga and walking to be beneficial.  Eat a balanced and nourishing diet.  Do little things that you enjoy. Have a cup of tea, take a bubble bath, read your favorite magazine, or listen to your favorite music.  Avoid alcohol.  Ask for help with household chores, cooking, grocery shopping, or running errands as needed. Do not try to do everything.  Talk to people close to you about how you are feeling. Get support from your partner, family members, friends, or other new moms.  Try to stay positive in how you think. Think about the things you are grateful for.  Do not spend a lot of time alone.  Only take medicine as  directed by your caregiver.  Keep all your postpartum appointments.  Let your caregiver know if you have any concerns. SEEK MEDICAL CARE IF: You are having a reaction or problems with your medicine. SEEK IMMEDIATE MEDICAL CARE IF:  You have suicidal feelings.  You feel you may harm the baby or someone else. Document Released: 03/01/2004 Document Revised: 08/20/2011 Document Reviewed: 04/03/2011 [facility] Patient Information 2014 [place], [place].

## 2014-01-12 NOTE — Progress Notes (Signed)
Subjective: Postpartum Day 2: Cesarean Delivery due to failed IOL of Di/Di twins Patient up ad lib, reports no syncope or dizziness. Feeding:  Breast/pumping/formjula supplementing  Contraceptive plan:  unsure  Objective: Vital signs in last 24 hours: Temp:  [97.8 F (36.6 C)-99 F (37.2 C)] 98.6 F (37 C) (08/04 0530) Pulse Rate:  [58-70] 70 (08/04 0530) Resp:  [19-20] 20 (08/04 0530) BP: (114-136)/(62-69) 114/68 mmHg (08/04 0530) SpO2:  [97 %] 97 % (08/03 1400)  Physical Exam:  General: alert and cooperative Lochia: appropriate Uterine Fundus: firm Abdomen:  + bowel sounds, non distended Incision: no significant drainage  Honeycomb dressing CDI DVT Evaluation: No evidence of DVT seen on physical exam. Homan's sign: Negative JP drain:   No significant drainage   Recent Labs  01/09/14 1140 01/10/14 0720 01/11/14 0615  HGB 12.1 12.8 10.8*  HCT 36.5 37.7 32.1*  WBC 10.9* 10.3 12.6*    Assessment/Plan: Status post Cesarean section day 2 Doing well postoperatively.  Continue current care. Plan for discharge tomorrow, Lactation consult and Circumcision prior to discharge Anemia - hemodynamicly stable.  Declines transfusion     Onyx Schirmer, CNM, MSN 01/12/2014. 7:58 AM

## 2014-01-12 NOTE — Discharge Instructions (Signed)
Cesarean Delivery  Cesarean delivery is the birth of a baby through a cut (incision) in the abdomen and womb (uterus).  LET YOUR HEALTH CARE PROVIDER KNOW ABOUT:  All medicines you are taking, including vitamins, herbs, eye drops, creams, and over-the-counter medicines.  Previous problems you or members of your family have had with the use of anesthetics.  Any blood disorders you have.  Previous surgeries you have had.  Medical conditions you have.  Any allergies you have.  Complicationsinvolving the pregnancy. RISKS AND COMPLICATIONS  Generally, this is a safe procedure. However, as with any procedure, complications can occur. Possible complications include:  Bleeding.  Infection.  Blood clots.  Injury to surrounding organs.  Problems with anesthesia.  Injury to the baby. BEFORE THE PROCEDURE   You may be given an antacid medicine to drink. This will prevent acid contents in your stomach from going into your lungs if you vomit during the surgery.  You may be given an antibiotic medicine to prevent infection. PROCEDURE   Hair may be removed from your pubic area and your lower abdomen. This is to prevent infection in the incision site.  A tube (Foley catheter) will be placed in your bladder to drain your urine from your bladder into a bag. This keeps your bladder empty during surgery.  An IV tube will be placed in your vein.  You may be given medicine to numb the lower half of your body (regional anesthetic). If you were in labor, you may have already had an epidural in place which can be used in both labor and cesarean delivery. You may possibly be given medicine to make you sleep (general anesthetic) though this is not as common.  An incision will be made in your abdomen that extends to your uterus. There are 2 basic kinds of incisions:  The horizontal (transverse) incision. Horizontal incisions are from side to side and are used for most routine cesarean  deliveries.  The vertical incision. The vertical incision is from the top of the abdomen to the bottom and is less commonly used. It is often done for women who have a serious complication (extreme prematurity) or under emergency situations.  The horizontal and vertical incisions may both be used at the same time. However, this is very uncommon.  An incision is then made in your uterus to deliver the baby.  Your baby will then be delivered.  Both incisions are then closed with absorbable stitches. AFTER THE PROCEDURE   If you were awake during the surgery, you will see your baby right away. If you were asleep, you will see your baby as soon as you are awake.  You may breastfeed your baby after surgery.  You may be able to get up and walk the same day as the surgery. If you need to stay in bed for a period of time, you will receive help to turn, cough, and take deep breaths after surgery. This helps prevent lung problems such as pneumonia.  Do not get out of bed alone the first time after surgery. You will need help getting out of bed until you are able to do this by yourself.  You may be able to shower the day after your cesarean delivery. After the bandage (dressing) is taken off the incision site, a nurse will assist you to shower if you would like help.  You will have pneumatic compression hose placed on your lower legs. This is done to prevent blood clots. When you are up   and walking regularly, they will no longer be necessary.  Do not cross your legs when you sit.  Save any blood clots that you pass. If you pass a clot while on the toilet, do not flush it. Call for the nurse. Tell the nurse if you think you are bleeding too much or passing too many clots.  You will be given medicine as needed. Let your health care providers know if you are hurting. You may also be given an antibiotic to prevent an infection.  Your IV tube will be taken out when you are drinking a reasonable  amount of fluids. The Foley catheter is taken out when you are up and walking.  If your blood type is Rh negative and your baby's blood type is Rh positive, you will be given a shot of anti-D immune globulin. This shot prevents you from having Rh problems with a future pregnancy. You should get the shot even if you had your tubes tied (tubal ligation).  If you are allowed to take the baby for a walk, place the baby in the bassinet and push it. Do not carry your baby in your arms. Document Released: 05/28/2005 Document Revised: 03/18/2013 Document Reviewed: 12/17/2012 [facility] Patient Information 2015 [place], [place]. This information is not intended to replace advice given to you by your health care provider. Make sure you discuss any questions you have with your health care provider. Postpartum Depression and Baby Blues The postpartum period begins right after the birth of a baby. During this time, there is often a great amount of joy and excitement. It is also a time of many changes in the life of the parents. Regardless of how many times a mother gives birth, each child brings new challenges and dynamics to the family. It is not unusual to have feelings of excitement along with confusing shifts in moods, emotions, and thoughts. All mothers are at risk of developing postpartum depression or the "baby blues." These mood changes can occur right after giving birth, or they may occur many months after giving birth. The baby blues or postpartum depression can be mild or severe. Additionally, postpartum depression can go away rather quickly, or it can be a long-term condition.  CAUSES Raised hormone levels and the rapid drop in those levels are thought to be a main cause of postpartum depression and the baby blues. A number of hormones change during and after pregnancy. Estrogen and progesterone usually decrease right after the delivery of your baby. The levels of thyroid hormone and various cortisol steroids  also rapidly drop. Other factors that play a role in these mood changes include major life events and genetics.  RISK FACTORS If you have any of the following risks for the baby blues or postpartum depression, know what symptoms to watch out for during the postpartum period. Risk factors that may increase the likelihood of getting the baby blues or postpartum depression include:  Having a personal or family history of depression.   Having depression while being pregnant.   Having premenstrual mood issues or mood issues related to oral contraceptives.  Having a lot of life stress.   Having marital conflict.   Lacking a social support network.   Having a baby with special needs.   Having health problems, such as diabetes.  SIGNS AND SYMPTOMS Symptoms of baby blues include:  Brief changes in mood, such as going from extreme happiness to sadness.  Decreased concentration.   Difficulty sleeping.   Crying spells, tearfulness.  Irritability.   Anxiety.  Symptoms of postpartum depression typically begin within the first month after giving birth. These symptoms include:  Difficulty sleeping or excessive sleepiness.   Marked weight loss.   Agitation.   Feelings of worthlessness.   Lack of interest in activity or food.  Postpartum psychosis is a very serious condition and can be dangerous. Fortunately, it is rare. Displaying any of the following symptoms is cause for immediate medical attention. Symptoms of postpartum psychosis include:   Hallucinations and delusions.   Bizarre or disorganized behavior.   Confusion or disorientation.  DIAGNOSIS  A diagnosis is made by an evaluation of your symptoms. There are no medical or lab tests that lead to a diagnosis, but there are various questionnaires that a health care provider may use to identify those with the baby blues, postpartum depression, or psychosis. Often, a screening tool called the [place]  Postnatal Depression Scale is used to diagnose depression in the postpartum period.  TREATMENT The baby blues usually goes away on its own in 1-2 weeks. Social support is often all that is needed. You will be encouraged to get adequate sleep and rest. Occasionally, you may be given medicines to help you sleep.  Postpartum depression requires treatment because it can last several months or longer if it is not treated. Treatment may include individual or group therapy, medicine, or both to address any social, physiological, and psychological factors that may play a role in the depression. Regular exercise, a healthy diet, rest, and social support may also be strongly recommended.  Postpartum psychosis is more serious and needs treatment right away. Hospitalization is often needed. HOME CARE INSTRUCTIONS  Get as much rest as you can. Nap when the baby sleeps.   Exercise regularly. Some women find yoga and walking to be beneficial.   Eat a balanced and nourishing diet.   Do little things that you enjoy. Have a cup of tea, take a bubble bath, read your favorite magazine, or listen to your favorite music.  Avoid alcohol.   Ask for help with household chores, cooking, grocery shopping, or running errands as needed. Do not try to do everything.   Talk to people close to you about how you are feeling. Get support from your partner, family members, friends, or other new moms.  Try to stay positive in how you think. Think about the things you are grateful for.   Do not spend a lot of time alone.   Only take over-the-counter or prescription medicine as directed by your health care provider.  Keep all your postpartum appointments.   Let your health care provider know if you have any concerns.  SEEK MEDICAL CARE IF: You are having a reaction to or problems with your medicine. SEEK IMMEDIATE MEDICAL CARE IF:  You have suicidal feelings.   You think you may harm the baby or someone  else. MAKE SURE YOU:  Understand these instructions.  Will watch your condition.  Will get help right away if you are not doing well or get worse. Document Released: 03/01/2004 Document Revised: 06/02/2013 Document Reviewed: 03/09/2013 [facility] Patient Information 2015 [place], [place]. This information is not intended to replace advice given to you by your health care provider. Make sure you discuss any questions you have with your health care provider. Breastfeeding Deciding to breastfeed is one of the best choices you can make for you and your baby. A change in hormones during pregnancy causes your breast tissue to grow and increases the number  and size of your milk ducts. These hormones also allow proteins, sugars, and fats from your blood supply to make breast milk in your milk-producing glands. Hormones prevent breast milk from being released before your baby is born as well as prompt milk flow after birth. Once breastfeeding has begun, thoughts of your baby, as well as his or her sucking or crying, can stimulate the release of milk from your milk-producing glands.  BENEFITS OF BREASTFEEDING For Your Baby  Your first milk (colostrum) helps your baby's digestive system function better.   There are antibodies in your milk that help your baby fight off infections.   Your baby has a lower incidence of asthma, allergies, and sudden infant death syndrome.   The nutrients in breast milk are better for your baby than infant formulas and are designed uniquely for your baby's needs.   Breast milk improves your baby's brain development.   Your baby is less likely to develop other conditions, such as childhood obesity, asthma, or type 2 diabetes mellitus.  For You   Breastfeeding helps to create a very special bond between you and your baby.   Breastfeeding is convenient. Breast milk is always available at the correct temperature and costs nothing.   Breastfeeding helps to burn  calories and helps you lose the weight gained during pregnancy.   Breastfeeding makes your uterus contract to its prepregnancy size faster and slows bleeding (lochia) after you give birth.   Breastfeeding helps to lower your risk of developing type 2 diabetes mellitus, osteoporosis, and breast or ovarian cancer later in life. SIGNS THAT YOUR BABY IS HUNGRY Early Signs of Hunger  Increased alertness or activity.  Stretching.  Movement of the head from side to side.  Movement of the head and opening of the mouth when the corner of the mouth or cheek is stroked (rooting).  Increased sucking sounds, smacking lips, cooing, sighing, or squeaking.  Hand-to-mouth movements.  Increased sucking of fingers or hands. Late Signs of Hunger  Fussing.  Intermittent crying. Extreme Signs of Hunger Signs of extreme hunger will require calming and consoling before your baby will be able to breastfeed successfully. Do not wait for the following signs of extreme hunger to occur before you initiate breastfeeding:   Restlessness.  A loud, strong cry.   Screaming. BREASTFEEDING BASICS Breastfeeding Initiation  Find a comfortable place to sit or lie down, with your neck and back well supported.  Place a pillow or rolled up blanket under your baby to bring him or her to the level of your breast (if you are seated). Nursing pillows are specially designed to help support your arms and your baby while you breastfeed.  Make sure that your baby's abdomen is facing your abdomen.   Gently massage your breast. With your fingertips, massage from your chest wall toward your nipple in a circular motion. This encourages milk flow. You may need to continue this action during the feeding if your milk flows slowly.  Support your breast with 4 fingers underneath and your thumb above your nipple. Make sure your fingers are well away from your nipple and your baby's mouth.   Stroke your baby's lips gently  with your finger or nipple.   When your baby's mouth is open wide enough, quickly bring your baby to your breast, placing your entire nipple and as much of the colored area around your nipple (areola) as possible into your baby's mouth.   More areola should be visible above your baby's upper  lip than below the lower lip.   Your baby's tongue should be between his or her lower gum and your breast.   Ensure that your baby's mouth is correctly positioned around your nipple (latched). Your baby's lips should create a seal on your breast and be turned out (everted).  It is common for your baby to suck about 2-3 minutes in order to start the flow of breast milk. Latching Teaching your baby how to latch on to your breast properly is very important. An improper latch can cause nipple pain and decreased milk supply for you and poor weight gain in your baby. Also, if your baby is not latched onto your nipple properly, he or she may swallow some air during feeding. This can make your baby fussy. Burping your baby when you switch breasts during the feeding can help to get rid of the air. However, teaching your baby to latch on properly is still the best way to prevent fussiness from swallowing air while breastfeeding. Signs that your baby has successfully latched on to your nipple:    Silent tugging or silent sucking, without causing you pain.   Swallowing heard between every 3-4 sucks.    Muscle movement above and in front of his or her ears while sucking.  Signs that your baby has not successfully latched on to nipple:   Sucking sounds or smacking sounds from your baby while breastfeeding.  Nipple pain. If you think your baby has not latched on correctly, slip your finger into the corner of your baby's mouth to break the suction and place it between your baby's gums. Attempt breastfeeding initiation again. Signs of Successful Breastfeeding Signs from your baby:   A gradual decrease in the  number of sucks or complete cessation of sucking.   Falling asleep.   Relaxation of his or her body.   Retention of a small amount of milk in his or her mouth.   Letting go of your breast by himself or herself. Signs from you:  Breasts that have increased in firmness, weight, and size 1-3 hours after feeding.   Breasts that are softer immediately after breastfeeding.  Increased milk volume, as well as a change in milk consistency and color by the fifth day of breastfeeding.   Nipples that are not sore, cracked, or bleeding. Signs That Your Pecola Leisure is Getting Enough Milk  Wetting at least 3 diapers in a 24-hour period. The urine should be clear and pale yellow by age 647 days.  At least 3 stools in a 24-hour period by age 647 days. The stool should be soft and yellow.  At least 3 stools in a 24-hour period by age 70 days. The stool should be seedy and yellow.  No loss of weight greater than 10% of birth weight during the first 35 days of age.  Average weight gain of 4-7 ounces (113-198 g) per week after age 64 days.  Consistent daily weight gain by age 647 days, without weight loss after the age of 2 weeks. After a feeding, your baby may spit up a small amount. This is common. BREASTFEEDING FREQUENCY AND DURATION Frequent feeding will help you make more milk and can prevent sore nipples and breast engorgement. Breastfeed when you feel the need to reduce the fullness of your breasts or when your baby shows signs of hunger. This is called "breastfeeding on demand." Avoid introducing a pacifier to your baby while you are working to establish breastfeeding (the first 4-6 weeks after your  baby is born). After this time you may choose to use a pacifier. Research has shown that pacifier use during the first year of a baby's life decreases the risk of sudden infant death syndrome (SIDS). Allow your baby to feed on each breast as long as he or she wants. Breastfeed until your baby is finished  feeding. When your baby unlatches or falls asleep while feeding from the first breast, offer the second breast. Because newborns are often sleepy in the first few weeks of life, you may need to awaken your baby to get him or her to feed. Breastfeeding times will vary from baby to baby. However, the following rules can serve as a guide to help you ensure that your baby is properly fed:  Newborns (babies 28 weeks of age or younger) may breastfeed every 1-3 hours.  Newborns should not go longer than 3 hours during the day or 5 hours during the night without breastfeeding.  You should breastfeed your baby a minimum of 8 times in a 24-hour period until you begin to introduce solid foods to your baby at around 16 months of age. BREAST MILK PUMPING Pumping and storing breast milk allows you to ensure that your baby is exclusively fed your breast milk, even at times when you are unable to breastfeed. This is especially important if you are going back to work while you are still breastfeeding or when you are not able to be present during feedings. Your lactation consultant can give you guidelines on how long it is safe to store breast milk.  A breast pump is a machine that allows you to pump milk from your breast into a sterile bottle. The pumped breast milk can then be stored in a refrigerator or freezer. Some breast pumps are operated by hand, while others use electricity. Ask your lactation consultant which type will work best for you. Breast pumps can be purchased, but some hospitals and breastfeeding support groups lease breast pumps on a monthly basis. A lactation consultant can teach you how to hand express breast milk, if you prefer not to use a pump.  CARING FOR YOUR BREASTS WHILE YOU BREASTFEED Nipples can become dry, cracked, and sore while breastfeeding. The following recommendations can help keep your breasts moisturized and healthy:  Avoid using soap on your nipples.   Wear a supportive bra.  Although not required, special nursing bras and tank tops are designed to allow access to your breasts for breastfeeding without taking off your entire bra or top. Avoid wearing underwire-style bras or extremely tight bras.  Air dry your nipples for 3-46minutes after each feeding.   Use only cotton bra pads to absorb leaked breast milk. Leaking of breast milk between feedings is normal.   Use lanolin on your nipples after breastfeeding. Lanolin helps to maintain your skin's normal moisture barrier. If you use pure lanolin, you do not need to wash it off before feeding your baby again. Pure lanolin is not toxic to your baby. You may also hand express a few drops of breast milk and gently massage that milk into your nipples and allow the milk to air dry. In the first few weeks after giving birth, some women experience extremely full breasts (engorgement). Engorgement can make your breasts feel heavy, warm, and tender to the touch. Engorgement peaks within 3-5 days after you give birth. The following recommendations can help ease engorgement:  Completely empty your breasts while breastfeeding or pumping. You may want to start by applying warm,  moist heat (in the shower or with warm water-soaked hand towels) just before feeding or pumping. This increases circulation and helps the milk flow. If your baby does not completely empty your breasts while breastfeeding, pump any extra milk after he or she is finished.  Wear a snug bra (nursing or regular) or tank top for 1-2 days to signal your body to slightly decrease milk production.  Apply ice packs to your breasts, unless this is too uncomfortable for you.  Make sure that your baby is latched on and positioned properly while breastfeeding. If engorgement persists after 48 hours of following these recommendations, contact your health care provider or a Advertising copywriter. OVERALL HEALTH CARE RECOMMENDATIONS WHILE BREASTFEEDING  Eat healthy foods.  Alternate between meals and snacks, eating 3 of each per day. Because what you eat affects your breast milk, some of the foods may make your baby more irritable than usual. Avoid eating these foods if you are sure that they are negatively affecting your baby.  Drink milk, fruit juice, and water to satisfy your thirst (about 10 glasses a day).   Rest often, relax, and continue to take your prenatal vitamins to prevent fatigue, stress, and anemia.  Continue breast self-awareness checks.  Avoid chewing and smoking tobacco.  Avoid alcohol and drug use. Some medicines that may be harmful to your baby can pass through breast milk. It is important to ask your health care provider before taking any medicine, including all over-the-counter and prescription medicine as well as vitamin and herbal supplements. It is possible to become pregnant while breastfeeding. If birth control is desired, ask your health care provider about options that will be safe for your baby. SEEK MEDICAL CARE IF:   You feel like you want to stop breastfeeding or have become frustrated with breastfeeding.  You have painful breasts or nipples.  Your nipples are cracked or bleeding.  Your breasts are red, tender, or warm.  You have a swollen area on either breast.  You have a fever or chills.  You have nausea or vomiting.  You have drainage other than breast milk from your nipples.  Your breasts do not become full before feedings by the fifth day after you give birth.  You feel sad and depressed.  Your baby is too sleepy to eat well.  Your baby is having trouble sleeping.   Your baby is wetting less than 3 diapers in a 24-hour period.  Your baby has less than 3 stools in a 24-hour period.  Your baby's skin or the white part of his or her eyes becomes yellow.   Your baby is not gaining weight by 48 days of age. SEEK IMMEDIATE MEDICAL CARE IF:   Your baby is overly tired (lethargic) and does not want to  wake up and feed.  Your baby develops an unexplained fever. Document Released: 05/28/2005 Document Revised: 06/02/2013 Document Reviewed: 11/19/2012 [facility] Patient Information 2015 [place], [place]. This information is not intended to replace advice given to you by your health care provider. Make sure you discuss any questions you have with your health care provider.

## 2014-01-12 NOTE — Anesthesia Postprocedure Evaluation (Signed)
Anesthesia Post Note  Patient: Sierra Fletcher  Procedure(s) Performed: Procedure(s) (LRB): CESAREAN SECTION (N/A)  Anesthesia type: Spinal  Patient location: PACU  Post pain: Pain level controlled  Post assessment: Post-op Vital signs reviewed  Last Vitals:  Filed Vitals:   01/12/14 0530  BP: 114/68  Pulse: 70  Temp: 37 C  Resp: 20    Post vital signs: Reviewed  Level of consciousness: awake  Complications: No apparent anesthesia complications

## 2014-01-12 NOTE — Lactation Note (Signed)
This note was copied from the chart of Sierra Jolaine ClickCarmen Sanguinetti. Lactation Consultation Note  Patient Name: Sierra Fletcher ZOXWR'UToday's Date: 01/12/2014 Reason for consult: Follow-up assessment;Multiple gestation;Infant < 6lbs Mom reports that "A" baby is latching better than "B" and she is continuing to offer breasts to both, hand express and pump and syringe feed the babies as needed.  Mom says she is still only obtaining drops.  LC encouraged her to continue with feeding plan and to call for [facility] with [facility], [facility]N staff.  Babies were circumcised today and have been sleepy this evening. Both twins have output which is appropriate for this hour of life and are nursing for 20-25 minutes when able to latch.  Formula supplement up to 20 ml's for "A" and 9 ml's for "B" being tolerated.  LATCH scores for both babies=6 today per RN assessment.   Maternal Data    Feeding    LATCH Score/Interventions                      Lactation Tools Discussed/Used   Feeding plan for q3h feedings, breastfeed and supplement with syringe, then pump and may feed ebm instead of formula when available  Consult Status Consult Status: Follow-up Date: 01/13/14 Follow-up type: In-patient    Warrick ParisianBryant, [place] [facility] 01/12/2014, 8:02 PM

## 2014-01-13 DIAGNOSIS — Z98891 History of uterine scar from previous surgery: Secondary | ICD-10-CM

## 2014-01-13 LAB — GLUCOSE, CAPILLARY: GLUCOSE-CAPILLARY: 80 mg/dL (ref 70–99)

## 2014-01-13 NOTE — Lactation Note (Addendum)
This note was copied from the chart of [name]. Lactation Consultation Note  Patient Name: Sierra BaizeBoyB Destane Xiao UJWJX'BToday's Date: 01/13/2014 Reason for consult: Follow-up assessment;Infant < 6lbs;Multiple gestation;Initial assessment Baby 63 hours of life. Mom reports babies circumcised yesterday and did not nurse well when returned, but cluster-fed through the night. Assisted mom to latch baby to right breast in football position. Mom states that she had attempted to use NS with baby, but baby did not tolerate well and latches better without it. Parents had done suck-training with baby, and baby doing much better latching now. Several attempts needed, but baby able to latch deeply, suckle rhythmically with a few swallows noted. Baby nursed well for 20 minutes, then voided and stooled. Mom is able to hand express colostrum, and is post-pumping after each breastfeed, but is only seeing drops so far while pumping. Enc mom to continue post-pumping. Parents state that the pump they have ordered should be on porch at home. Discussed hospital DEBP rental if needed. Made 2 OP appointments for 01-20-14, at 9am and 10:30am to assist with latching babies. Enc mom to continue feeding with cues. Discussed breast engorgement prevention and treatment. Referred parents to Baby and Me booklet for EBM storage guidelines and number of diapers to expect. Enc parents to call for assistance as needed.   Maternal Data Has patient been taught Hand Expression?: Yes Does the patient have breastfeeding experience prior to this delivery?: No  Feeding Feeding Type: Breast Fed Length of feed: 20 min  LATCH Score/Interventions Latch: Repeated attempts needed to sustain latch, nipple held in mouth throughout feeding, stimulation needed to elicit sucking reflex. Intervention(s): Adjust position;Assist with latch;Breast compression  Audible Swallowing: A few with stimulation  Type of Nipple: Flat Intervention(s): No  intervention needed  Comfort (Breast/Nipple): Soft / non-tender     Hold (Positioning): No assistance needed to correctly position infant at breast. Intervention(s): Breastfeeding basics reviewed;Support Pillows  LATCH Score: 7  Lactation Tools Discussed/Used     Consult Status Consult Status: Complete    Sierra Fletcher, Sierra Fletcher 01/13/2014, 9:33 AM

## 2014-01-13 NOTE — Lactation Note (Signed)
This note was copied from the chart of Sierra Fletcher. Lactation Consultation Note  Patient Name: Sierra Fletcher ZOXWR'UToday's Date: 01/13/2014 Reason for consult: Follow-up assessment;Infant < 6lbs;Multiple gestation  Baby 63 hours of life. Baby recently supplemented in nursery. Baby had spit up first bottle, but tolerated second bottle better. Parents report babies did not nurse well after being circumcised yesterday, but then cluster-fed through the night. Enc parents to continue to nurse with cues and at least 8-12 times/24 hours, and supplement with each feeding. Made LC OP appointments for both babies to assist with latching babies individually and in tandem. Enc parents to call LC with any questions/concerns.   Maternal Data    Feeding Feeding Type:  (Baby recently supplemented in nursery, parents report this baby the better nurser of the two.) Nipple Type: Slow - flow  LATCH Score/Interventions                      Lactation Tools Discussed/Used     Consult Status Consult Status: Complete    Sierra Fletcher 01/13/2014, 9:41 AM

## 2014-01-20 ENCOUNTER — Ambulatory Visit (HOSPITAL_COMMUNITY)
Admit: 2014-01-20 | Discharge: 2014-01-20 | Disposition: A | Discharge status: Home / Self Care | Payer: BC Managed Care – PPO | Attending: Obstetrics and Gynecology | Admitting: Obstetrics and Gynecology

## 2014-01-20 DIAGNOSIS — O923 Agalactia: Secondary | ICD-10-CM | POA: Insufficient documentation

## 2014-01-20 NOTE — Lactation Note (Signed)
Lactation Consult  Mother's reason for visit:  Twins , latching,milk production ,etc  Visit Type:  Feeding assessment for Twins  Appointment Notes: Twins , latching each , and tandem , confirmed  Consult:  Initial Lactation Consultant:  Kathrin Greathouse  ________________________________________________________________________ Baby's Name: Sierra Fletcher  Date of Birth: 01/10/2013  Pediatrician: Dr. Netta Cedars  Gender: female  Gestational Age: [redacted]w[redacted]d (At Birth)  Birth Weight: 5 lb 15.4 oz (2705 g)  Weight at Discharge: Weight: 5 lb 14.2 oz (2670 g) Date of Discharge: 01/13/2014  Filed Weights   01/10/14 1812 01/12/14 0000 01/12/14 2355  Weight: 5 lb 15.4 oz (2705 g) 5 lb 13 oz (2637 g) 5 lb 14.2 oz (2670 g)  [facility]  Location:Pediatrician's office  Weight today: 6-11.3 oz    ______________________________________________________________________  Mother's Name: [name] Type of delivery:  C/section  Breastfeeding Experience:  Twin A - good at latching ,but not when hungry  Maternal Medical Conditions:  Pregnancy induced hypertension Maternal Medications:  Per mom was on B/P meds , but not after D/C , PNV , Iron   ________________________________________________________________________  Breastfeeding History (Post Discharge)  Frequency of breastfeeding:  Every 3 hours  Duration of feeding:  10 -15 mins   Pumping with a DEBP Medela every 3 hours after attempting to feed, up to 30 mins a breast . LC recommended post pumping both breast together for 15 -20 mins. Reassured mom it would get easier.  Infant Intake and Output Assessment  Voids:  8+ 24 hrs.  Color:  Mom did not specify  Stools:  3+-4 hrs.  Color:  Mom did not specify   ________________________________________________________________________  Maternal Breast Assessment  Breast:  Full Nipple:  Erect Pain level:  0 Pain interventions:  Expressed breast  milk  _______________________________________________________________________ Feeding Assessment/Evaluation  Initial feeding assessment:  Infant's oral assessment:  WNL- good ROM of tongue noted   Positioning:  Football Left breast  LATCH documentation:  Latch:  2 = Grasps breast easily, tongue down, lips flanged, rhythmical sucking.  Audible swallowing:  2 = Spontaneous and intermittent  Type of nipple:  2 = Everted at rest and after stimulation  Comfort (Breast/Nipple):  1 = Filling, red/small blisters or bruises, mild/mod discomfort  Hold (Positioning):  1 = Assistance needed to correctly position infant at breast and maintain latch  LATCH score:  8 with depth   Attached assessment:  Deep  Lips flanged:  Yes.    Lips untucked:  Yes.    Suck assessment:  Nutritive  Tools:  Per mom has a DEBP at home  Instructed on use and cleaning of tool:  No.  Pre-feed weight:  3042g, ( 6 Ib 11.3oz )  Post-feed weight:  3056g,( 6-11.8 oz )  Amount transferred:  14 ml  Amount supplemented:  none  Additional Feeding Assessment -   Infant's oral assessment:  WNL  Positioning:  Football Left breast  LATCH documentation:  Latch:  2 = Grasps breast easily, tongue down, lips flanged, rhythmical sucking.  Audible swallowing:  2 = Spontaneous and intermittent  Type of nipple:  2 = Everted at rest and after stimulation  Comfort (Breast/Nipple):  1 = Filling, red/small blisters or bruises, mild/mod discomfort  Hold (Positioning):  1 = Assistance needed to correctly position infant at breast and maintain latch  LATCH score: 8, Dad assisted with depth this time and did great   Attached assessment:  Shallow  Lips flanged:  Yes.  Lips untucked:  Yes.    Suck assessment:  Nutritive  Tools:  no Instructed on use and cleaning of tool:  No.  Pre-feed weight:3056g , 6-11.8 oz  Post-feed weight:  3080 g , 6-12.6 oz  Amount transferred:24 ml  Amount supplemented:  None    Total amount  pumped post feed:  R ( only the left ) , L - 24 ml   Total amount transferred: 38  ml Total supplement given:  None   Baby A - Chandler fed with an hour of LC O/P apt 30 ml , LC consult Summary - Baby Chandler woke up with diaper change and weight , latched with ease.  Feeding pattern was consistent and noted multiply swallows , increased with breast compressions. Moms breast softened , and per mom feeding was comfortable. Baby fed .10 mins.   See Reubin MilanBaby B - Cole chart [facility]  LC offered mom a 2nd LC apt and mom plans to call when needed, also suggested [facility]

## 2014-01-20 NOTE — Lactation Note (Signed)
Lactation Consult  Mother's reason for visit: Twins, latching , milk production, Visit Type:  Feeding assessment  Appointment Notes:  Twins , latching each , and in tandem , confirmed  Consult:  Initial  Lactation Consultant:  Kathrin Greathouse  ________________________________________________________________________ Sierra Fletcher Name: Sierra Fletcher  Date of Birth: 01/10/2013  Pediatrician: Dr. Netta Cedars   Gender: female  Gestational Age: [redacted]w[redacted]d (At Birth)  Birth Weight: 5 lb 4.7 oz (2400 g)  Weight at Discharge: Weight: 5 lb 2.7 oz (2345 g) Date of Discharge: 01/13/2014  Filed Weights   01/10/14 1813 01/12/14 0001 01/13/14 0007  Weight: 5 lb 4.7 oz (2400 g) 5 lb 2.9 oz (2350 g) 5 lb 2.7 oz (2345 g)  [facility]  Location:Pediatrician's office  Weight today: 6-0 oz ,2720 g     ________________________________________________________________________  Mother's Name: [name] Type of delivery:  C/section  Breastfeeding Experience:  1st babies , Twin A - per Mom good at latching , but not when hungry                                                                   Twin B - per mom , small mouth , more difficult to nurse                                                Latch attempt before each feeding and then pump  Maternal Medical Conditions:  Pregnancy induced hypertension Maternal Medications:  Per mom was on B/P meds , was not D/C on B/P meds, PNV and Iron   ________________________________________________________________________  Breastfeeding History (Post Discharge)  Frequency of breastfeeding:  Every  Duration of feeding: range 2-3 mins , and up to 15 mins   Supplementation- per mom if the baby isn't latching , supplement with EBM or formula , mom did not specify the brand   Formula:  Volume ml Frequency:   Total volume per day:  Mom did not specify per 24 hours , just the last feeding 60 ml        Brand:   Breastmilk:   Volume  Frequency:   Total volume per day:    Method:  Bottle- Tommy Tippy bottle system and nipples   Pumping with a DEBP Medela, every 3 hours , EBM yield =2 oz , 1 oz off each breast   Infant Intake and Output Assessment  Voids:  8 +  in 24 hrs.  Color:  Mom did not specify  Stools:  3 + in 24 hrs.  Color:  Mom did not specify   ________________________________________________________________________  Maternal Breast Assessment  Breast:  Full Nipple:  Erect Pain level:  0 Pain interventions:  Expressed breast milk  _______________________________________________________________________ Feeding Assessment/Evaluation  Initial feeding assessment:  Infant's oral assessment:  WNL, good range of motion with tongue   Positioning:  Football , attempted to feed and Cole at [employer] seem interested , after is brother latched,  Richardson Dopp woke up , and latched at the same time Ave Filter was feeding. See below  for latch score  Right breast  LATCH  documentation:  Latch:  2 = Grasps breast easily, tongue down, lips flanged, rhythmical sucking.  Audible swallowing:  2 = Spontaneous and intermittent  Type of nipple:  2 = Everted at rest and after stimulation  Comfort (Breast/Nipple):  1 = Filling, red/small blisters or bruises, mild/mod discomfort  Hold (Positioning):  1 = Assistance needed to correctly position infant at breast and maintain latch  LATCH score:  8   Depth difficult to achieve at 1st , LC had shown dad with Ave Filterhandler how he could assist mom with latch and depth  , and dad assisted mom  With Lenetta QuakerBaby Cole when he was more interested in eating . Dad did great helping mom achieve depth. Baby Richardson DoppCole able to maintain a consistent  Pattern with multiply swallows , increasing with Breast compressions. Feeding lasted greater than 10 mins, and he released on his own , nipple normal shape. Per mom comfortable  with feeding.   Attached assessment:  Deep  Lips flanged:  Yes.    Lips  untucked:  Yes.    Suck assessment:  Nutritive  Tools:  None at consult , mom is using a DEBP at home - Medela  Instructed on use and cleaning of tool:  No  Mom asked questions about the flange size and mentioned the nipple shape was mis shaped when finishing with pumping . LC suggested to switch back to #24 Flange and see if it made a difference  Wet diaper changed prior to re-latch  Pre-feed weight:  2702  g  (5 lb. 15.3oz.) Post-feed weight:  2734  g (6 lb.0.4 oz.) Amount transferred:  32 ml  Amount supplemented:  None  Baby Cole had eaten with in an hour prior to consult - 2 oz of formula ,  No additional feeding   Total amount pumped post feed:  R 32 ml    L - only fed on the right breast   Total amount transferred:  32 ml Total supplement given:  None  Consult summary , Due to [place] having a full meal with in an hour of consult,after 1st weight did not seem interested in feeding ,  so [place] placed skin to skin in a football position while we worked with his brother. He eventually woke up and latched with assist and fed >10 mins .  Mom and dad seemed very pleased both babies could feed together, especially Richardson DoppCole , because per mom he has had more of a challenge latching at home.  Lactation Plan of Care - Praise mom and dad for their efforts                                          - Mom - encouraged fluids , rest , especially water ,nutritious snacks and meals                                         - Feedings - every 2-3 hours and with feeding cues , cluster feedings are normal with growth spurts                                          - increase latching  Up to 6  times a day , ( average feeding 15 -20 mins)                                          - Option #1 - at feeding breast feed one twin and bottle fed the other , next feeding switch                                          - Option #2 - with assist - feed both at the breast                                         - Pumping -  decrease flange size to #24 and try pumping both breast together for 15 -20 mins , and then hand express.

## 2014-04-12 ENCOUNTER — Encounter (HOSPITAL_COMMUNITY): Payer: Self-pay | Admitting: Obstetrics and Gynecology

## 2016-08-20 LAB — OB RESULTS CONSOLE HEPATITIS B SURFACE ANTIGEN: Hepatitis B Surface Ag: NEGATIVE

## 2016-08-20 LAB — OB RESULTS CONSOLE HIV ANTIBODY (ROUTINE TESTING): HIV: NONREACTIVE

## 2016-08-20 LAB — OB RESULTS CONSOLE RPR: RPR: NONREACTIVE

## 2016-08-20 LAB — OB RESULTS CONSOLE GC/CHLAMYDIA
CHLAMYDIA, DNA PROBE: NEGATIVE
Gonorrhea: NEGATIVE

## 2016-08-20 LAB — OB RESULTS CONSOLE RUBELLA ANTIBODY, IGM: RUBELLA: IMMUNE

## 2017-03-22 LAB — OB RESULTS CONSOLE GBS: GBS: NEGATIVE

## 2017-03-26 ENCOUNTER — Other Ambulatory Visit: Payer: Self-pay | Admitting: Obstetrics and Gynecology

## 2017-04-08 ENCOUNTER — Encounter (HOSPITAL_COMMUNITY): Payer: Self-pay | Admitting: *Deleted

## 2017-04-15 ENCOUNTER — Encounter (HOSPITAL_COMMUNITY)
Admission: RE | Admit: 2017-04-15 | Discharge: 2017-04-15 | Disposition: A | Discharge status: Home / Self Care | Payer: BLUE CROSS/BLUE SHIELD | Source: Ambulatory Visit | Attending: Obstetrics and Gynecology | Admitting: Obstetrics and Gynecology

## 2017-04-15 HISTORY — DX: Morbid (severe) obesity due to excess calories: E66.01

## 2017-04-15 HISTORY — DX: Personal history of other complications of pregnancy, childbirth and the puerperium: Z87.59

## 2017-04-15 HISTORY — DX: Unspecified abnormal cytological findings in specimens from vagina: R87.629

## 2017-04-15 LAB — CBC
HEMATOCRIT: 34.9 % — AB (ref 36.0–46.0)
Hemoglobin: 11.3 g/dL — ABNORMAL LOW (ref 12.0–15.0)
MCH: 27.4 pg (ref 26.0–34.0)
MCHC: 32.4 g/dL (ref 30.0–36.0)
MCV: 84.5 fL (ref 78.0–100.0)
Platelets: 261 10*3/uL (ref 150–400)
RBC: 4.13 MIL/uL (ref 3.87–5.11)
RDW: 15.1 % (ref 11.5–15.5)
WBC: 8.9 10*3/uL (ref 4.0–10.5)

## 2017-04-15 LAB — TYPE AND SCREEN
ABO/RH(D): O POS
ANTIBODY SCREEN: NEGATIVE

## 2017-04-15 NOTE — Patient Instructions (Signed)
Jolaine ClickCarmen Jelley  04/15/2017   Your procedure is scheduled on:  04/16/2017  [facility]  Pick up the phone at the desk and dial [phone]  Call this number if you have problems the morning of surgery:[phone]  Remember:   Do not eat food:After Midnight.  Do not drink clear liquids: After Midnight.  Take these medicines the morning of surgery with A SIP OF WATER: may take protonix   Do not wear jewelry, make-up or nail polish.  Do not wear lotions, powders, or perfumes. Do not wear deodorant.  Do not shave 48 hours prior to surgery.  Do not bring valuables to the hospital.  [facility] not   responsible for any belongings or valuables brought to the hospital.  Contacts, dentures or bridgework may not be worn into surgery.  Leave suitcase in the car. After surgery it may be brought to your room.  For patients admitted to the hospital, checkout time is 11:00 AM the day of              discharge.    N/A   Please read over the following fact sheets that you were given:   Surgical Site Infection Prevention

## 2017-04-16 ENCOUNTER — Encounter (HOSPITAL_COMMUNITY): Payer: Self-pay | Admitting: Certified Registered Nurse Anesthetist

## 2017-04-16 ENCOUNTER — Inpatient Hospital Stay (HOSPITAL_COMMUNITY): Payer: BLUE CROSS/BLUE SHIELD | Admitting: Certified Registered Nurse Anesthetist

## 2017-04-16 ENCOUNTER — Inpatient Hospital Stay (HOSPITAL_COMMUNITY)
Admission: RE | Admit: 2017-04-16 | Discharge: 2017-04-18 | DRG: 787 | Disposition: A | Discharge status: Home / Self Care | Payer: BLUE CROSS/BLUE SHIELD | Source: Ambulatory Visit | Attending: Obstetrics and Gynecology | Admitting: Obstetrics and Gynecology

## 2017-04-16 ENCOUNTER — Encounter (HOSPITAL_COMMUNITY)
Admission: RE | Disposition: A | Discharge status: Home / Self Care | Payer: Self-pay | Source: Ambulatory Visit | Attending: Obstetrics and Gynecology

## 2017-04-16 DIAGNOSIS — Z98891 History of uterine scar from previous surgery: Secondary | ICD-10-CM

## 2017-04-16 DIAGNOSIS — Z3A40 40 weeks gestation of pregnancy: Secondary | ICD-10-CM | POA: Diagnosis not present

## 2017-04-16 DIAGNOSIS — O9962 Diseases of the digestive system complicating childbirth: Secondary | ICD-10-CM | POA: Diagnosis present

## 2017-04-16 DIAGNOSIS — O9081 Anemia of the puerperium: Secondary | ICD-10-CM | POA: Diagnosis not present

## 2017-04-16 DIAGNOSIS — D62 Acute posthemorrhagic anemia: Secondary | ICD-10-CM | POA: Diagnosis not present

## 2017-04-16 DIAGNOSIS — O34211 Maternal care for low transverse scar from previous cesarean delivery: Principal | ICD-10-CM | POA: Diagnosis present

## 2017-04-16 DIAGNOSIS — K219 Gastro-esophageal reflux disease without esophagitis: Secondary | ICD-10-CM | POA: Diagnosis present

## 2017-04-16 DIAGNOSIS — O99214 Obesity complicating childbirth: Secondary | ICD-10-CM | POA: Diagnosis present

## 2017-04-16 LAB — RPR: RPR: NONREACTIVE

## 2017-04-16 SURGERY — Surgical Case
Anesthesia: Epidural

## 2017-04-16 MED ORDER — CEFAZOLIN SODIUM-DEXTROSE 2-4 GM/100ML-% IV SOLN
2.0000 g | INTRAVENOUS | Status: AC
  Administered 2017-04-16: 3 g via INTRAVENOUS

## 2017-04-16 MED ORDER — LACTATED RINGERS IV SOLN
INTRAVENOUS | Status: DC
  Administered 2017-04-16 – 2017-04-17 (×2): via INTRAVENOUS

## 2017-04-16 MED ORDER — METOCLOPRAMIDE HCL 5 MG/ML IJ SOLN: 10.0000 mg | Freq: Once | INTRAMUSCULAR | Status: DC | PRN

## 2017-04-16 MED ORDER — MENTHOL 3 MG MT LOZG: 1.0000 | LOZENGE | OROMUCOSAL | Status: DC | PRN

## 2017-04-16 MED ORDER — PHENYLEPHRINE 8 MG IN D5W 100 ML (0.08MG/ML) PREMIX OPTIME
INJECTION | INTRAVENOUS | Status: AC
  Filled 2017-04-16: qty 100

## 2017-04-16 MED ORDER — IBUPROFEN 600 MG PO TABS
600.0000 mg | ORAL_TABLET | Freq: Four times a day (QID) | ORAL | Status: DC
  Administered 2017-04-17 – 2017-04-18 (×7): 600 mg via ORAL
  Filled 2017-04-16 (×7): qty 1

## 2017-04-16 MED ORDER — TETANUS-DIPHTH-ACELL PERTUSSIS 5-2.5-18.5 LF-MCG/0.5 IM SUSP: 0.5000 mL | Freq: Once | INTRAMUSCULAR | Status: DC

## 2017-04-16 MED ORDER — NALOXONE HCL 0.4 MG/ML IJ SOLN: 0.4000 mg | INTRAMUSCULAR | Status: DC | PRN

## 2017-04-16 MED ORDER — FENTANYL CITRATE (PF) 100 MCG/2ML IJ SOLN
INTRAMUSCULAR | Status: AC
  Filled 2017-04-16: qty 2

## 2017-04-16 MED ORDER — DIPHENHYDRAMINE HCL 25 MG PO CAPS: 25.0000 mg | ORAL_CAPSULE | Freq: Four times a day (QID) | ORAL | Status: DC | PRN

## 2017-04-16 MED ORDER — MORPHINE SULFATE (PF) 0.5 MG/ML IJ SOLN
INTRAMUSCULAR | Status: AC
  Filled 2017-04-16: qty 10

## 2017-04-16 MED ORDER — FENTANYL CITRATE (PF) 100 MCG/2ML IJ SOLN
INTRAMUSCULAR | Status: DC | PRN
  Administered 2017-04-16: 20 ug via INTRATHECAL

## 2017-04-16 MED ORDER — SODIUM CHLORIDE 0.9% FLUSH: 3.0000 mL | INTRAVENOUS | Status: DC | PRN

## 2017-04-16 MED ORDER — ZOLPIDEM TARTRATE 5 MG PO TABS: 5.0000 mg | ORAL_TABLET | Freq: Every evening | ORAL | Status: DC | PRN

## 2017-04-16 MED ORDER — SODIUM CHLORIDE 0.9 % IR SOLN
Status: DC | PRN
  Administered 2017-04-16: 1000 mL

## 2017-04-16 MED ORDER — NALBUPHINE HCL 10 MG/ML IJ SOLN: 5.0000 mg | Freq: Once | INTRAMUSCULAR | Status: DC | PRN

## 2017-04-16 MED ORDER — COCONUT OIL OIL
1.0000 "application " | TOPICAL_OIL | Status: DC | PRN
  Administered 2017-04-18: 1 via TOPICAL
  Filled 2017-04-16: qty 120

## 2017-04-16 MED ORDER — DIPHENHYDRAMINE HCL 50 MG/ML IJ SOLN: 12.5000 mg | INTRAMUSCULAR | Status: DC | PRN

## 2017-04-16 MED ORDER — DEXAMETHASONE SODIUM PHOSPHATE 4 MG/ML IJ SOLN
INTRAMUSCULAR | Status: DC | PRN
  Administered 2017-04-16: 4 mg via INTRAVENOUS

## 2017-04-16 MED ORDER — BUPIVACAINE IN DEXTROSE 0.75-8.25 % IT SOLN
INTRATHECAL | Status: DC | PRN
  Administered 2017-04-16: 1.4 mL via INTRATHECAL

## 2017-04-16 MED ORDER — MORPHINE SULFATE (PF) 0.5 MG/ML IJ SOLN
INTRAMUSCULAR | Status: DC | PRN
Start: 2017-04-16 — End: 2017-04-16
  Administered 2017-04-16: .2 mg via INTRATHECAL

## 2017-04-16 MED ORDER — SIMETHICONE 80 MG PO CHEW
80.0000 mg | CHEWABLE_TABLET | Freq: Three times a day (TID) | ORAL | Status: DC
  Administered 2017-04-16 – 2017-04-18 (×5): 80 mg via ORAL
  Filled 2017-04-16 (×5): qty 1

## 2017-04-16 MED ORDER — PHENYLEPHRINE HCL 10 MG/ML IJ SOLN
INTRAMUSCULAR | Status: DC | PRN
  Administered 2017-04-16 (×3): 80 ug via INTRAVENOUS

## 2017-04-16 MED ORDER — DIPHENHYDRAMINE HCL 25 MG PO CAPS: 25.0000 mg | ORAL_CAPSULE | ORAL | Status: DC | PRN

## 2017-04-16 MED ORDER — KETOROLAC TROMETHAMINE 30 MG/ML IJ SOLN: 30.0000 mg | Freq: Four times a day (QID) | INTRAMUSCULAR | Status: AC | PRN

## 2017-04-16 MED ORDER — SIMETHICONE 80 MG PO CHEW: 80.0000 mg | CHEWABLE_TABLET | ORAL | Status: DC | PRN

## 2017-04-16 MED ORDER — METOCLOPRAMIDE HCL 5 MG/ML IJ SOLN
INTRAMUSCULAR | Status: AC
  Filled 2017-04-16: qty 2

## 2017-04-16 MED ORDER — BUPIVACAINE IN DEXTROSE 0.75-8.25 % IT SOLN
INTRATHECAL | Status: AC
  Filled 2017-04-16: qty 2

## 2017-04-16 MED ORDER — NALOXONE HCL 2 MG/2ML IJ SOSY
1.0000 ug/kg/h | PREFILLED_SYRINGE | INTRAVENOUS | Status: DC | PRN
  Filled 2017-04-16: qty 2

## 2017-04-16 MED ORDER — LACTATED RINGERS IV SOLN
INTRAVENOUS | Status: DC | PRN
  Administered 2017-04-16: 10:00:00 via INTRAVENOUS

## 2017-04-16 MED ORDER — CEFAZOLIN SODIUM 10 G IJ SOLR
INTRAMUSCULAR | Status: AC
  Filled 2017-04-16: qty 3000

## 2017-04-16 MED ORDER — OXYTOCIN 10 UNIT/ML IJ SOLN
INTRAVENOUS | Status: DC | PRN
  Administered 2017-04-16: 40 [IU] via INTRAVENOUS

## 2017-04-16 MED ORDER — SCOPOLAMINE 1 MG/3DAYS TD PT72
MEDICATED_PATCH | TRANSDERMAL | Status: DC | PRN
  Administered 2017-04-16: 1 via TRANSDERMAL

## 2017-04-16 MED ORDER — SCOPOLAMINE 1 MG/3DAYS TD PT72
MEDICATED_PATCH | TRANSDERMAL | Status: AC
  Filled 2017-04-16: qty 1

## 2017-04-16 MED ORDER — KETOROLAC TROMETHAMINE 30 MG/ML IJ SOLN
INTRAMUSCULAR | Status: AC
  Filled 2017-04-16: qty 1

## 2017-04-16 MED ORDER — KETOROLAC TROMETHAMINE 30 MG/ML IJ SOLN
30.0000 mg | Freq: Four times a day (QID) | INTRAMUSCULAR | Status: AC | PRN
  Administered 2017-04-16: 30 mg via INTRAMUSCULAR

## 2017-04-16 MED ORDER — MEPERIDINE HCL 25 MG/ML IJ SOLN: 6.2500 mg | INTRAMUSCULAR | Status: DC | PRN

## 2017-04-16 MED ORDER — DIBUCAINE 1 % RE OINT: 1.0000 "application " | TOPICAL_OINTMENT | RECTAL | Status: DC | PRN

## 2017-04-16 MED ORDER — FENTANYL CITRATE (PF) 100 MCG/2ML IJ SOLN: 25.0000 ug | INTRAMUSCULAR | Status: DC | PRN

## 2017-04-16 MED ORDER — OXYTOCIN 40 UNITS IN LACTATED RINGERS INFUSION - SIMPLE MED: 2.5000 [IU]/h | INTRAVENOUS | Status: AC

## 2017-04-16 MED ORDER — NALBUPHINE HCL 10 MG/ML IJ SOLN: 5.0000 mg | INTRAMUSCULAR | Status: DC | PRN

## 2017-04-16 MED ORDER — OXYCODONE HCL 5 MG PO TABS: 10.0000 mg | ORAL_TABLET | ORAL | Status: DC | PRN

## 2017-04-16 MED ORDER — SIMETHICONE 80 MG PO CHEW
80.0000 mg | CHEWABLE_TABLET | ORAL | Status: DC
  Administered 2017-04-17: 80 mg via ORAL
  Filled 2017-04-16 (×2): qty 1

## 2017-04-16 MED ORDER — PHENYLEPHRINE 40 MCG/ML (10ML) SYRINGE FOR IV PUSH (FOR BLOOD PRESSURE SUPPORT)
PREFILLED_SYRINGE | INTRAVENOUS | Status: AC
  Filled 2017-04-16: qty 10

## 2017-04-16 MED ORDER — ONDANSETRON HCL 4 MG/2ML IJ SOLN
INTRAMUSCULAR | Status: DC | PRN
  Administered 2017-04-16: 4 mg via INTRAVENOUS

## 2017-04-16 MED ORDER — ONDANSETRON HCL 4 MG/2ML IJ SOLN: 4.0000 mg | Freq: Three times a day (TID) | INTRAMUSCULAR | Status: DC | PRN

## 2017-04-16 MED ORDER — PRENATAL MULTIVITAMIN CH
1.0000 | ORAL_TABLET | Freq: Every day | ORAL | Status: DC
  Administered 2017-04-18: 1 via ORAL
  Filled 2017-04-16 (×2): qty 1

## 2017-04-16 MED ORDER — ONDANSETRON HCL 4 MG/2ML IJ SOLN
INTRAMUSCULAR | Status: AC
  Filled 2017-04-16: qty 2

## 2017-04-16 MED ORDER — SENNOSIDES-DOCUSATE SODIUM 8.6-50 MG PO TABS
2.0000 | ORAL_TABLET | ORAL | Status: DC
  Administered 2017-04-18: 2 via ORAL
  Filled 2017-04-16 (×2): qty 2

## 2017-04-16 MED ORDER — METOCLOPRAMIDE HCL 5 MG/ML IJ SOLN
INTRAMUSCULAR | Status: DC | PRN
  Administered 2017-04-16: 10 mg via INTRAVENOUS

## 2017-04-16 MED ORDER — ACETAMINOPHEN 325 MG PO TABS
650.0000 mg | ORAL_TABLET | ORAL | Status: DC | PRN
  Administered 2017-04-16 – 2017-04-18 (×6): 650 mg via ORAL
  Filled 2017-04-16 (×6): qty 2

## 2017-04-16 MED ORDER — WITCH HAZEL-GLYCERIN EX PADS: 1.0000 "application " | MEDICATED_PAD | CUTANEOUS | Status: DC | PRN

## 2017-04-16 MED ORDER — PANTOPRAZOLE SODIUM 40 MG PO TBEC
40.0000 mg | DELAYED_RELEASE_TABLET | Freq: Every day | ORAL | Status: DC
  Administered 2017-04-16 – 2017-04-17 (×2): 40 mg via ORAL
  Filled 2017-04-16 (×2): qty 1

## 2017-04-16 MED ORDER — PHENYLEPHRINE 8 MG IN D5W 100 ML (0.08MG/ML) PREMIX OPTIME
INJECTION | INTRAVENOUS | Status: DC | PRN
  Administered 2017-04-16: 30 ug/min via INTRAVENOUS

## 2017-04-16 MED ORDER — LACTATED RINGERS IV SOLN
INTRAVENOUS | Status: DC
  Administered 2017-04-16 (×3): via INTRAVENOUS

## 2017-04-16 MED ORDER — OXYCODONE HCL 5 MG PO TABS
5.0000 mg | ORAL_TABLET | ORAL | Status: DC | PRN
Start: 2017-04-16 — End: 2017-04-18

## 2017-04-16 MED ORDER — OXYTOCIN 10 UNIT/ML IJ SOLN
INTRAMUSCULAR | Status: AC
  Filled 2017-04-16: qty 4

## 2017-04-16 MED ORDER — NALBUPHINE HCL 10 MG/ML IJ SOLN
5.0000 mg | INTRAMUSCULAR | Status: DC | PRN
  Administered 2017-04-16 – 2017-04-17 (×4): 5 mg via SUBCUTANEOUS
  Filled 2017-04-16 (×4): qty 1

## 2017-04-16 SURGICAL SUPPLY — 38 items
BENZOIN TINCTURE PRP APPL 2/3 (GAUZE/BANDAGES/DRESSINGS) ×3 IMPLANT
CHLORAPREP W/TINT 26ML (MISCELLANEOUS) ×3 IMPLANT
CLAMP CORD UMBIL (MISCELLANEOUS) IMPLANT
CLOSURE STERI STRIP 1/2 X4 (GAUZE/BANDAGES/DRESSINGS) ×3 IMPLANT
CLOTH BEACON ORANGE TIMEOUT ST (SAFETY) ×3 IMPLANT
CONTAINER PREFILL 10% NBF 15ML (MISCELLANEOUS) IMPLANT
DRSG OPSITE POSTOP 4X10 (GAUZE/BANDAGES/DRESSINGS) ×3 IMPLANT
ELECT REM PT RETURN 9FT ADLT (ELECTROSURGICAL) ×3
ELECTRODE REM PT RTRN 9FT ADLT (ELECTROSURGICAL) ×1 IMPLANT
EXTRACTOR VACUUM M CUP 4 TUBE (SUCTIONS) IMPLANT
EXTRACTOR VACUUM M CUP 4' TUBE (SUCTIONS)
GLOVE BIO SURGEON STRL SZ7.5 (GLOVE) ×3 IMPLANT
GLOVE BIOGEL PI IND STRL 7.0 (GLOVE) ×1 IMPLANT
GLOVE BIOGEL PI IND STRL 7.5 (GLOVE) ×1 IMPLANT
GLOVE BIOGEL PI INDICATOR 7.0 (GLOVE) ×2
GLOVE BIOGEL PI INDICATOR 7.5 (GLOVE) ×2
GOWN STRL REUS W/TWL LRG LVL3 (GOWN DISPOSABLE) ×6 IMPLANT
HOVERMATT SINGLE USE (MISCELLANEOUS) ×3 IMPLANT
KIT ABG SYR 3ML LUER SLIP (SYRINGE) IMPLANT
NEEDLE HYPO 25X5/8 SAFETYGLIDE (NEEDLE) IMPLANT
NS IRRIG 1000ML POUR BTL (IV SOLUTION) ×3 IMPLANT
PACK C SECTION WH (CUSTOM PROCEDURE TRAY) ×3 IMPLANT
PAD OB MATERNITY 4.3X12.25 (PERSONAL CARE ITEMS) ×3 IMPLANT
PENCIL SMOKE EVAC W/HOLSTER (ELECTROSURGICAL) ×3 IMPLANT
RETRACTOR TRAXI PANNICULUS (MISCELLANEOUS) ×1 IMPLANT
RTRCTR C-SECT PINK 25CM LRG (MISCELLANEOUS) ×3 IMPLANT
STRIP CLOSURE SKIN 1/2X4 (GAUZE/BANDAGES/DRESSINGS) ×2 IMPLANT
SUT CHROMIC 2 0 CT 1 (SUTURE) ×3 IMPLANT
SUT MNCRL AB 3-0 PS2 27 (SUTURE) ×3 IMPLANT
SUT PLAIN 2 0 XLH (SUTURE) ×3 IMPLANT
SUT VIC AB 0 CT1 36 (SUTURE) ×3 IMPLANT
SUT VIC AB 0 CTX 36 (SUTURE) ×8
SUT VIC AB 0 CTX36XBRD ANBCTRL (SUTURE) ×4 IMPLANT
SUT VIC AB 2-0 SH 27 (SUTURE) ×4
SUT VIC AB 2-0 SH 27XBRD (SUTURE) ×2 IMPLANT
TOWEL OR 17X24 6PK STRL BLUE (TOWEL DISPOSABLE) ×3 IMPLANT
TRAXI PANNICULUS RETRACTOR (MISCELLANEOUS) ×2
TRAY FOLEY BAG SILVER LF 14FR (SET/KITS/TRAYS/PACK) ×3 IMPLANT

## 2017-04-16 NOTE — Progress Notes (Signed)
Notified Dr. Su Hiltoberts of elevated blood pressures this afternoon after transferring to mother baby. Dr. Su Hiltoberts stated to just watch blood pressures and to notify MD if they continue to increase. Will continue to monitor. Earl Galasborne, Linda HedgesStefanie [place]ud[place]

## 2017-04-16 NOTE — Op Note (Signed)
Cesarean Section Procedure Note  Indications: P2 at 40wks with h/o c-section for repeat c-section  Pre-operative Diagnosis: 1.40wks 2.Prior Cesarean Section   Post-operative Diagnosis: 1.40wks 2.Prior Cesarean Section  Procedure: REPEAT CESAREAN SECTION with VACUUM ASSISTANCE  Surgeon: Osborn Cohooberts, Nala Kachel, MD    Assistants: Odelia GageHeather K., RNFA  Anesthesia: Regional  Anesthesiologist: Mal AmabileFoster, Michael, MD   Procedure Details  The patient was taken to the operating room secondary to h/o c-section after the risks, benefits, complications, treatment options, and expected outcomes were discussed with the patient.  The patient concurred with the proposed plan, giving informed consent which was signed and witnessed. The patient was taken to Operating Room C-Section Suite, identified as Jolaine ClickCarmen Shreiner and the procedure verified as C-Section Delivery. A Time Out was held and the above information confirmed.  After induction of anesthesia by obtaining a spinal, the patient was prepped and draped in the usual sterile manner. A Pfannenstiel skin incision was made and carried down through the subcutaneous tissue to the underlying layer of fascia.  The fascia was incised bilaterally and extended transversely bilaterally with the Mayo scissors. Kocher clamps were placed on the inferior aspect of the fascial incision and the underlying rectus muscle was separated from the fascia. The same was done on the superior aspect of the fascial incision.  The peritoneum was identified, entered bluntly and extended manually.  An Alexis self-retaining retractor was placed.  The utero-vesical peritoneal reflection was incised transversely and the bladder flap was bluntly freed from the lower uterine segment. A low transverse uterine incision was made with the scalpel and extended bilaterally with the bandage scissors.  The infant head was difficult to deliver through incision with just fundal pressure so a vacuum was placed for  assistance and the infant was then delivered in vertex position without difficulty.  After the umbilical cord was clamped and cut, the infant was handed to the awaiting pediatricians.  Cord blood was obtained for evaluation.  The placenta was removed intact and appeared to be within normal limits. The uterus was cleared of all clots and debris. The uterine incision was closed with running interlocking sutures of 0 Vicryl and a second imbricating layer was performed as well.  A figure of eight stitch was placed at the left angle to obtain hemostasis due to some oozing. Bilateral tubes and ovaries appeared to be within normal limits.  Good hemostasis was noted.  Copious irrigation was performed until clear.  The peritoneum was repaired with 2-0 chromic via a running suture.  The fascia was reapproximated with a running suture of 0 Vicryl. The subcutaneous tissue was reapproximated with 3 interrupted sutures of 2-0 plain.  The skin was reapproximated with a subcuticular suture of 3-0 monocryl.  Steristrips were applied.  Instrument, sponge, and needle counts were correct prior to abdominal closure and at the conclusion of the case.  The patient was awaiting transfer to the recovery room in good condition.  Findings: Live female infant with Apgars 8 at one minute and 8 at five minutes.  Normal appearing bilateral ovaries and fallopian tubes were noted.  Estimated Blood Loss:  500 ml         Drains: foley to gravity 150 cc         Total IV Fluids: 3000 ml         Specimens to Pathology: none (Placenta to L&D)         Complications:  None; patient tolerated the procedure well.  Disposition: PACU - hemodynamically stable.         Condition: stable  Attending Attestation: I performed the procedure.

## 2017-04-16 NOTE — Lactation Note (Signed)
This note was copied from a baby's chart. Lactation Consultation Note  Patient Name: Sierra Fletcher ZOXWR'UToday's Date: 04/16/2017 Reason for consult: Initial assessment;Other (Comment)(Baby transitioned in NICU and had 2 bottles of formula.)  Baby 12 hours old. Mom reports that this baby is latching better than her twins did. Mom states that she is putting baby to breast first and then supplementing with formula. Mom reports that baby is nursing at least 20 minutes, so she has not been using DEBP. Enc mom to pump to see how much milk she is producing. Discussed supply and demand. Enc mom to call for assistance as needed.   Maternal Data Has patient been taught Hand Expression?: Yes Does the patient have breastfeeding experience prior to this delivery?: Yes  Feeding Feeding Type: Breast Fed Length of feed: 30 min  LATCH Score                   Interventions    Lactation Tools Discussed/Used     Consult Status Consult Status: Follow-up Date: 04/17/17 Follow-up type: In-patient    Sierra Fletcher 04/16/2017, 10:58 PM

## 2017-04-16 NOTE — Transfer of Care (Signed)
Immediate Anesthesia Transfer of Care Note  Patient: Sierra Fletcher  Procedure(s) Performed: Repeat CESAREAN SECTION (N/A )  Patient Location: PACU  Anesthesia Type:Spinal  Level of Consciousness: awake, alert , oriented and patient cooperative  Airway & Oxygen Therapy: Patient Spontanous Breathing  Post-op Assessment: Report given to RN and Post -op Vital signs reviewed and stable  Post vital signs: Reviewed and stable  Last Vitals:  Vitals:   04/16/17 0839  BP: 140/86  Pulse: 88  Resp: 18  Temp: 36.9 C    Last Pain:  Vitals:   04/16/17 0839  TempSrc: Oral         Complications: No apparent anesthesia complications

## 2017-04-16 NOTE — Plan of Care (Signed)
  Education: Knowledge of General Education information will improve 04/16/2017 1442 - Completed/Met by Tollie Eth, RN Note Admission education, safety and unit protocols reviewed with patient and patient's mother.  04/16/2017 1442 - Reactivated by Tollie Eth, RN 04/16/2017 1438 - Completed/Met by Tollie Eth, RN Note Admission education, safety and unit protocols reviewed with patient and patient's mother.

## 2017-04-16 NOTE — Anesthesia Procedure Notes (Addendum)
Spinal  Patient location during procedure: OR Start time: 04/16/2017 9:36 AM Reason for block: procedure for pain Staffing Anesthesiologist: Heather RobertsSinger, James, MD Performed: anesthesiologist  Preanesthetic Checklist Completed: patient identified, site marked, surgical consent, pre-op evaluation, timeout performed, IV checked, risks and benefits discussed and monitors and equipment checked Spinal Block Patient position: sitting Prep: DuraPrep Patient monitoring: heart rate, continuous pulse ox, blood pressure and cardiac monitor Approach: midline Location: L4-5 Injection technique: single-shot Needle Needle type: Sprotte  Needle gauge: 25 G Needle length: 10 cm Assessment Sensory level: T4

## 2017-04-16 NOTE — H&P (Addendum)
Sierra Fletcher is a 36 y.o. female presenting for repeat c-section.  Denies LOF, abd pain, ctxs, vb and reports good FM.  OB History    Gravida Para Term Preterm AB Living   2 1 1     2    SAB TAB Ectopic Multiple Live Births         1 2     Past Medical History:  Diagnosis Date  . Acute sinusitis   . Cloudy urine    Urine culture negative 05/2013  . Female infertility    clomid with the pregnancy  . History of gestational hypertension   . Irregular periods   . Leukorrhea   . Morbid obesity (HCC)   . Polycystic ovaries    PCOS- on metformin  . Tachycardia   . Twin pregnancy    Di/Di growth U/S every 4 weeks  . Urinary tract infectious disease   . Vaginal Pap smear, abnormal    Past Surgical History:  Procedure Laterality Date  . COLPOSCOPY    . ENDOMETRIAL BIOPSY     11/2012, GYN   Family History: family history includes Breast cancer (age of onset: 4948) in her mother; Hypertension in her father and mother. Social History:  reports that  has never smoked. she has never used smokeless tobacco. She reports that she does not drink alcohol or use drugs.     Maternal Diabetes: No Genetic Screening: Normal Maternal Ultrasounds/Referrals: Normal Fetal Ultrasounds or other Referrals:  None Maternal Substance Abuse:  No Significant Maternal Medications:  None Significant Maternal Lab Results:  Lab values include: Group B Strep negative Other Comments:  None  ROS  Non-contributory  History   Blood pressure 140/86, pulse 88, temperature 98.4 F (36.9 C), temperature source Oral, resp. rate 18, last menstrual period 07/10/2016, unknown if currently breastfeeding. Exam Physical Exam  Lungs CTA CV RRR Abd gravid, NT Ext no calf tenderness  Prenatal labs: ABO, Rh: --/--/O POS (11/05 1120) Antibody: NEG (11/05 1120) Rubella: Immune (03/12 0000) RPR: Non Reactive (11/05 1120)  HBsAg: Negative (03/12 0000)  HIV: Non-reactive (03/12 0000)  GBS: Negative (10/12 0000)    Assessment/Plan: P2 at 40wks with h/o c-section scheduled for repeat c-section. R/B/A reviewed.  Questions answered and consent s/w.   Kambry Takacs Y 04/16/2017, 9:15 AM

## 2017-04-16 NOTE — Anesthesia Preprocedure Evaluation (Signed)
Anesthesia Evaluation  Patient identified by MRN, date of birth, ID band Patient awake    Reviewed: Allergy & Precautions, NPO status , Patient's Chart, lab work & pertinent test results  Airway Mallampati: III  TM Distance: >3 FB Neck ROM: Full    Dental no notable dental hx. (+) Teeth Intact   Pulmonary neg pulmonary ROS,    Pulmonary exam normal breath sounds clear to auscultation       Cardiovascular negative cardio ROS Normal cardiovascular exam Rhythm:Regular Rate:Normal     Neuro/Psych negative neurological ROS  negative psych ROS   GI/Hepatic Neg liver ROS, GERD  Medicated and Controlled,  Endo/Other  Morbid obesityPCOS  Renal/GU negative Renal ROS  negative genitourinary   Musculoskeletal negative musculoskeletal ROS (+)   Abdominal (+) + obese,   Peds  Hematology  (+) anemia ,   Anesthesia Other Findings   Reproductive/Obstetrics (+) Pregnancy Previous C/Section                             Anesthesia Physical Anesthesia Plan  ASA: III  Anesthesia Plan: Epidural   Post-op Pain Management:    Induction:   PONV Risk Score and Plan: 2  Airway Management Planned:   Additional Equipment:   Intra-op Plan:   Post-operative Plan: Extubation in OR  Informed Consent: I have reviewed the patients History and Physical, chart, labs and discussed the procedure including the risks, benefits and alternatives for the proposed anesthesia with the patient or authorized representative who has indicated his/her understanding and acceptance.   Dental advisory given  Plan Discussed with: CRNA, Anesthesiologist and Surgeon  Anesthesia Plan Comments:         Anesthesia Quick Evaluation

## 2017-04-16 NOTE — Anesthesia Postprocedure Evaluation (Signed)
Anesthesia Post Note  Patient: Sierra Fletcher  Procedure(s) Performed: Repeat CESAREAN SECTION (N/A )     Patient location during evaluation: PACU Anesthesia Type: Spinal Level of consciousness: oriented and awake and alert Pain management: pain level controlled Vital Signs Assessment: post-procedure vital signs reviewed and stable Respiratory status: spontaneous breathing, respiratory function stable and nonlabored ventilation Cardiovascular status: blood pressure returned to baseline and stable Postop Assessment: no headache, no backache, no apparent nausea or vomiting, spinal receding and patient able to bend at knees Anesthetic complications: no    Last Vitals:  Vitals:   04/16/17 1100 04/16/17 1115  BP: 123/63 137/66  Pulse: 69 72  Resp: 16 15  Temp: 36.7 C   SpO2: 100% 98%    Last Pain:  Vitals:   04/16/17 1100  TempSrc: Oral   Pain Goal:                 Rian Koon A.

## 2017-04-17 LAB — CBC
HCT: 25.3 % — ABNORMAL LOW (ref 36.0–46.0)
Hemoglobin: 8.4 g/dL — ABNORMAL LOW (ref 12.0–15.0)
MCH: 27.6 pg (ref 26.0–34.0)
MCHC: 33.2 g/dL (ref 30.0–36.0)
MCV: 83.2 fL (ref 78.0–100.0)
PLATELETS: 244 10*3/uL (ref 150–400)
RBC: 3.04 MIL/uL — AB (ref 3.87–5.11)
RDW: 15.2 % (ref 11.5–15.5)
WBC: 13.4 10*3/uL — AB (ref 4.0–10.5)

## 2017-04-17 LAB — BIRTH TISSUE RECOVERY COLLECTION (PLACENTA DONATION)

## 2017-04-17 NOTE — Progress Notes (Signed)
Subjective: Postpartum Day 1: Cesarean Delivery Patient reports that pain is well-managed.Lochia normal.  Ambulating, voiding, tolerating diet as ordered without difficulty. Normal flatus.  Absent bowel movement.  Objective: Vital signs in last 24 hours: Temp:  [97.3 F (36.3 C)-98.4 F (36.9 C)] 98.2 F (36.8 C) (11/07 0500) Pulse Rate:  [62-86] 79 (11/07 0500) Resp:  [14-18] 18 (11/07 0500) BP: (105-158)/(52-93) 141/52 (11/07 0500) SpO2:  [98 %-100 %] 98 % (11/06 1600)  Physical Exam:  General: alert Lochia: appropriate Uterine Fundus: firm and appropriately tender Incision: dressing dry and clean DVT Evaluation: No evidence of DVT seen on physical exam. Edema minimal  Recent Labs    04/15/17 1120 04/17/17 0532  HGB 11.3* 8.4*  HCT 34.9* 25.3*    Assessment/Plan: Status post Cesarean section. Doing well postoperatively.  Continue current care. Anticipate discharge tomorrow  Josetta Wigal A 04/17/2017, 10:21 AM

## 2017-04-17 NOTE — Lactation Note (Signed)
This note was copied from a baby's chart. Lactation Consultation Note  Patient Name: Sierra Fletcher  Mom reports that baby is latching well.  Baby is also getting formula per parent's choice.  She denies questions/concerns at present time.  Encouraged to call for assist/concerns prn.   Maternal Data    Feeding Feeding Type: Formula Nipple Type: Slow - flow Length of feed: 15 min  LATCH Score                   Interventions    Lactation Tools Discussed/Used     Consult Status      Huston FoleyMOULDEN, Areeb Corron [name], 10:57 AM

## 2017-04-18 MED ORDER — FERROUS SULFATE 325 (65 FE) MG PO TABS
325.0000 mg | ORAL_TABLET | Freq: Every day | ORAL | 2 refills | Status: DC
Start: 2017-04-18 — End: 2019-08-24

## 2017-04-18 MED ORDER — IBUPROFEN 600 MG PO TABS: 600.0000 mg | ORAL_TABLET | Freq: Four times a day (QID) | ORAL | 2 refills | Status: DC | PRN

## 2017-04-18 MED ORDER — OXYCODONE-ACETAMINOPHEN 5-325 MG PO TABS: 1.0000 | ORAL_TABLET | ORAL | 0 refills | Status: DC | PRN

## 2017-04-18 NOTE — Discharge Instructions (Signed)
Anemia, Nonspecific Anemia is a condition in which the concentration of red blood cells or hemoglobin in the blood is below normal. Hemoglobin is a substance in red blood cells that carries oxygen to the tissues of the body. Anemia results in not enough oxygen reaching these tissues. What are the causes? Common causes of anemia include:  Excessive bleeding. Bleeding may be internal or external. This includes excessive bleeding from periods (in women) or from the intestine.  Poor nutrition.  Chronic kidney, thyroid, and liver disease.  Bone marrow disorders that decrease red blood cell production.  Cancer and treatments for cancer.  HIV, AIDS, and their treatments.  Spleen problems that increase red blood cell destruction.  Blood disorders.  Excess destruction of red blood cells due to infection, medicines, and autoimmune disorders.  What are the signs or symptoms?  Minor weakness.  Dizziness.  Headache.  Palpitations.  Shortness of breath, especially with exercise.  Paleness.  Cold sensitivity.  Indigestion.  Nausea.  Difficulty sleeping.  Difficulty concentrating. Symptoms may occur suddenly or they may develop slowly. How is this diagnosed? Additional blood tests are often needed. These help your health care provider determine the best treatment. Your health care provider will check your stool for blood and look for other causes of blood loss. How is this treated? Treatment varies depending on the cause of the anemia. Treatment can include:  Supplements of iron, vitamin B12, or folic acid.  Hormone medicines.  A blood transfusion. This may be needed if blood loss is severe.  Hospitalization. This may be needed if there is significant continual blood loss.  Dietary changes.  Spleen removal.  Follow these instructions at home: Keep all follow-up appointments. It often takes many weeks to correct anemia, and having your health care provider check on  your condition and your response to treatment is very important. Get help right away if:  You develop extreme weakness, shortness of breath, or chest pain.  You become dizzy or have trouble concentrating.  You develop heavy vaginal bleeding.  You develop a rash.  You have bloody or black, tarry stools.  You faint.  You vomit up blood.  You vomit repeatedly.  You have abdominal pain.  You have a fever or persistent symptoms for more than 2-3 days.  You have a fever and your symptoms suddenly get worse.  You are dehydrated. This information is not intended to replace advice given to you by your health care provider. Make sure you discuss any questions you have with your health care provider. Document Released: 07/05/2004 Document Revised: 11/09/2015 Document Reviewed: 11/21/2012 Elsevier Interactive Patient Education  2017 [employer] Postpartum Care After Cesarean Delivery The period of time right after you deliver your newborn is called the postpartum period. What kind of medical care will I receive?  You may continue to receive fluids and medicines through an IV tube inserted into one of your veins.  You may have small, flexible tube (catheter) draining urine from your bladder into a bag outside of your body. The catheter will be removed as soon as possible.  You may be given a squirt bottle to use when you go to the bathroom. You may use this until you are comfortable wiping as usual. To use the squirt bottle, follow these steps: ? Before you urinate, fill the squirt bottle with warm water. The water should be warm. Do not use hot water. ? After you urinate, while you are sitting on the toilet, use the squirt bottle to  rinse the area around your urethra and vaginal opening. This rinses away any urine and blood. ? You may do this instead of wiping. As you start healing, you may use the squirt bottle before wiping yourself. Make sure to wipe gently. ? Fill the squirt  bottle with clean water every time you use the bathroom.  You will be given sanitary pads to wear.  Your incision will be monitored to make sure it is healing properly. You will be told when it is safe for your stitches, staples, or skin adhesive tape to be removed. What can I expect?  You may not feel the need to urinate for several hours after delivery.  You will have some soreness and pain in your abdomen. You may have a small amount of blood or clear fluid coming from your incision.  If you are breastfeeding, you may have uterine contractions every time you breastfeed for up to several weeks postpartum. Uterine contractions help your uterus return to its normal size.  It is normal to have vaginal bleeding (lochia) after delivery. The amount and appearance of lochia is often similar to a menstrual period in the first week after delivery. It will gradually decrease over the next few weeks to a dry, yellow-brown discharge. For most women, lochia stops completely by 6-8 weeks after delivery. Vaginal bleeding can vary from woman to woman.  Within the first few days after delivery, you may have breast engorgement. This is when your breasts feel heavy, full, and uncomfortable. Your breasts may also throb and feel hard, tightly stretched, warm, and tender. After this occurs, you may have milk leaking from your breasts.Your health care provider can help you relieve discomfort due to breast engorgement. Breast engorgement should go away within a few days.  You may feel more sad or worried than normal due to hormonal changes after delivery. These feelings should not last more than a few days. If these feelings do not go away after several days, speak with your health care provider. How should I care for myself?  Tell your health care provider if you have pain or discomfort.  Drink enough water to keep your urine clear or pale yellow.  Wash your hands thoroughly with soap and water for at least 20  seconds after changing your sanitary pads or using the toilet, and before holding or feeding your baby.  If you are not breastfeeding, avoid touching your breasts a lot. Doing this can make your breasts produce more milk.  If you become weak or lightheaded, or you feel like you might faint, ask for help before: ? Getting out of bed. ? Showering.  Change your sanitary pads frequently. Watch for any changes in your flow, such as a sudden increase in volume, a change in color, or the passing of large blood clots. If you pass a blood clot from your vagina, save it to show to your health care provider. Do not flush blood clots down the toilet without having your health care provider look at them.  Make sure that all your vaccinations are up to date. This can help protect you and your baby from getting certain diseases. You may need to have immunizations done before you leave the hospital.  If desired, talk with your health care provider about methods of family planning or birth control (contraception). How can I start bonding with my baby? Spending as much time as possible with your baby is very important. During this time, you and your baby can get  to know each other and develop a bond. Having your baby stay with you in your room (rooming in) can give you time to get to know your baby. Rooming in can also help you become comfortable caring for your baby. Breastfeeding can also help you bond with your baby. How can I plan for returning home with my baby?  Make sure that you have a car seat installed in your vehicle. ? Your car seat should be checked by a certified car seat installer to make sure that it is installed safely. ? Make sure that your baby fits into the car seat safely.  Ask your health care provider any questions you have about caring for yourself or your baby. Make sure that you are able to contact your health care provider with any questions after leaving the hospital. This information  is not intended to replace advice given to you by your health care provider. Make sure you discuss any questions you have with your health care provider. Document Released: 02/20/2012 Document Revised: 10/31/2015 Document Reviewed: 05/02/2015 Elsevier Interactive Patient Education  [employer]2018 Else[employer] Inc[employer]

## 2017-04-18 NOTE — Discharge Summary (Signed)
OB Discharge Summary     Patient Name: Sierra ClickCarmen Fletcher DOB: 12/15/1980 MRN: [id number]  Date of admission: 04/16/2017 Delivering MD: Osborn CohoOBERTS, ANGELA   Date of discharge: 04/18/2017  Admitting diagnosis: Prior Cesarean Section Intrauterine pregnancy: 1626w0d     Secondary diagnosis:  Active Problems:   Status post repeat low transverse cesarean section Anemia due to acute blood loss  Additional problems: NA     Discharge diagnosis: Term Pregnancy Delivered                                                                                                Post partum procedures:NA  Augmentation: NA  Complications: None  Hospital course:  Sceduled C/S   36 y.o. yo G2P2003 at 4126w0d was admitted to the hospital 04/16/2017 for scheduled cesarean section with the following indication:Elective Repeat.  Membrane Rupture Time/Date: 10:02 AM ,04/16/2017   Patient delivered a Viable infant.04/16/2017  Details of operation can be found in separate operative note.  Pateint had an uncomplicated postpartum course.  She is ambulating, tolerating a regular diet, passing flatus, and urinating well. Patient is discharged home in stable condition on  04/18/17   Patient desired d/c on day 2.  She had a single episode of feeling her heart beating fast, but she was noted to have mild orthostatic changes in her pulse with standing.  Pre-delivery Hgb 11.3, day 1 post-op was 8.4.  Patient was ambulating to BR without syncope or dizziness, denied SOB, and declined blood transfusion.  She was placed on Fe daily and encouraged to push po fluids.  She was planning Mirena after 6 weeks.  Baby was initially transferred to NICU due to initial need for CPAP immediately after delivery, but transitioned to couplet care very quickly on day 1.         Physical exam  Vitals:   04/17/17 0915 04/17/17 1814 04/17/17 2151 04/18/17 0500  BP: (!) 114/55 (!) 107/41 (!) 128/44 (!) 145/43  Pulse: 69 79 79 72  Resp: 20 19  18   Temp:  98.3 F (36.8 C) 98.3 F (36.8 C)  98.3 F (36.8 C)  TempSrc: Oral Oral  Oral  SpO2:   100%    General: alert Lochia: appropriate Uterine Fundus: firm Incision: Healing well with no significant drainage, Dressing is clean, dry, and intact DVT Evaluation: No evidence of DVT seen on physical exam. Negative Homan's sign. Labs: CBC Latest Ref Rng & Units 04/17/2017 04/15/2017 01/11/2014  WBC 4.0 - 10.5 K/uL 13.4(H) 8.9 12.6(H)  Hemoglobin 12.0 - 15.0 g/dL 4.0(J8.4(L) 11.3(L) 10.8(L)  Hematocrit 36.0 - 46.0 % 25.3(L) 34.9(L) 32.1(L)  Platelets 150 - 400 K/uL 244 261 222   CMP Latest Ref Rng & Units 01/10/2014  Glucose 70 - 99 mg/dL 811(B107(H)  BUN 6 - 23 mg/dL 7  Creatinine 1.470.50 - 8.291.10 mg/dL 5.620.65  Sodium 130137 - 865147 mEq/L 136(L)  Potassium 3.7 - 5.3 mEq/L 3.8  Chloride 96 - 112 mEq/L 103  CO2 19 - 32 mEq/L 17(L)  Calcium 8.4 - 10.5 mg/dL 9.8  Total Protein 6.0 - 8.3 g/dL 7.1  Total  Bilirubin 0.3 - 1.2 mg/dL 0.5  Alkaline Phos 39 - 117 U/L 217(H)  AST 0 - 37 U/L 19  ALT 0 - 35 U/L 10    Discharge instruction: per After Visit Summary and "Baby and Me Booklet".  After visit meds:  Allergies as of 04/18/2017   No Known Allergies     Medication List    TAKE these medications   ferrous sulfate 325 (65 FE) MG tablet Take 1 tablet (325 mg total) daily with breakfast by mouth. What changed:  when to take this   ibuprofen 600 MG tablet Commonly known as:  ADVIL,MOTRIN Take 1 tablet (600 mg total) every 6 (six) hours as needed by mouth for mild pain.   ondansetron 4 MG disintegrating tablet Commonly known as:  ZOFRAN-ODT TAKE 1 TABLET BY MOUTH AS NEEDED FOR NAUSEA OR VOMITING   oxyCODONE-acetaminophen 5-325 MG tablet Commonly known as:  PERCOCET/ROXICET Take 1-2 tablets every 4 (four) hours as needed by mouth for severe pain (moderate - severe pain).   pantoprazole 40 MG tablet Commonly known as:  PROTONIX Take 40 mg by mouth daily.   prenatal multivitamin Tabs tablet Take 1 tablet  by mouth daily at 12 noon.       Diet: routine diet  Activity: Advance as tolerated. Pelvic rest for 6 weeks.   Outpatient follow up:6 weeks Follow up Appt:No future appointments. Follow up Visit:No Follow-up on file.  Postpartum contraception: IUD Mirena  Newborn Data: Live born female  Birth Weight: 9 lb 1.7 oz (4130 g) APGAR: 8, 8  Newborn Delivery   Birth date/time:  04/16/2017 10:03:00 Delivery type:  C-Section, Vacuum Assisted C-section categorization:  Repeat     Baby Feeding: Breast Disposition:home with mother   04/18/2017 Nigel BridgemanLATHAM, Adin Laker, CNM

## 2017-04-18 NOTE — Lactation Note (Signed)
This note was copied from a baby's chart. Lactation Consultation Note  Patient Name: Sierra Fletcher ZOXWR'UToday's Date: 04/18/2017 Reason for consult: Follow-up assessment   Baby 7851 hours old and mother has crack on R nipple. Mother has been given coconut oil. Provided comfort gels. Reviewed hand expression.  Drops expressed. Observed latch.  Demonstrated how to increase depth. Provided mother w/ pillows, turned baby tummy towards mother and brought her in closer. Sucks and swallows observed. Mom encouraged to feed baby 8-12 times/24 hours and with feeding cues.  Reviewed engorgement care and monitoring voids/stools. Encouraged breastfeeding before offering formula.  Explained supply and demand.  Maternal Data    Feeding Feeding Type: Breast Fed  LATCH Score Latch: Grasps breast easily, tongue down, lips flanged, rhythmical sucking.  Audible Swallowing: A few with stimulation  Type of Nipple: Everted at rest and after stimulation  Comfort (Breast/Nipple): Filling, red/small blisters or bruises, mild/mod discomfort  Hold (Positioning): Assistance needed to correctly position infant at breast and maintain latch.  LATCH Score: 7  Interventions Interventions: Comfort gels  Lactation Tools Discussed/Used     Consult Status Consult Status: Complete    Hardie PulleyBerkelhammer, Ruth Boschen 04/18/2017, 2:00 PM

## 2017-04-18 NOTE — Lactation Note (Signed)
This note was copied from a baby's chart. Lactation Consultation Note: Experienced BF mom reports this baby is latching much better than her boys did. No pain with latch. Reports baby is still hungry after nursing so has been giving bottle of formula after nursing. Has pumped once in last 24 hours. Has Spectra pump for home. Reports it took 5 days for her milk to come in with first babies. Encouragement given. Reviewed our phone number, OP appointments and BFS[facility] as resources for support after DC. To call prn  Patient Name: Sierra Fletcher ZOXWR'UToday's Date: 04/18/2017 Reason for consult: Follow-up assessment;NICU baby   Maternal Data Has patient been taught Hand Expression?: Yes Does the patient have breastfeeding experience prior to this delivery?: Yes  Feeding    LATCH Score                   Interventions    Lactation Tools Discussed/Used WIC Program: No   Consult Status Consult Status: Complete    Pamelia HoitWeeks, Latunya Kissick D 04/18/2017, 9:02 AM

## 2017-05-29 ENCOUNTER — Encounter (HOSPITAL_COMMUNITY): Payer: Self-pay | Admitting: Obstetrics and Gynecology

## 2019-03-06 ENCOUNTER — Other Ambulatory Visit: Payer: Self-pay | Admitting: Surgery

## 2019-03-06 ENCOUNTER — Other Ambulatory Visit (HOSPITAL_COMMUNITY): Payer: Self-pay | Admitting: Surgery

## 2019-03-27 ENCOUNTER — Ambulatory Visit (HOSPITAL_COMMUNITY)
Admission: RE | Admit: 2019-03-27 | Discharge: 2019-03-27 | Disposition: A | Discharge status: Home / Self Care | Payer: 59 | Source: Ambulatory Visit | Attending: Surgery | Admitting: Surgery

## 2019-03-27 ENCOUNTER — Other Ambulatory Visit: Payer: Self-pay

## 2019-04-10 ENCOUNTER — Encounter: Payer: Self-pay | Admitting: Dietician

## 2019-04-10 ENCOUNTER — Other Ambulatory Visit: Payer: Self-pay

## 2019-04-10 ENCOUNTER — Encounter: Payer: 59 | Attending: Surgery | Admitting: Dietician

## 2019-04-10 DIAGNOSIS — E669 Obesity, unspecified: Secondary | ICD-10-CM | POA: Diagnosis present

## 2019-04-10 NOTE — Progress Notes (Signed)
Nutrition Assessment for Bariatric Surgery Medical Nutrition Therapy  Appt Start Time: 7:25am    End Time: 8:00am  Patient was seen on 04/10/2019 for Pre-Operative Nutrition Assessment. Letter of approval faxed to [facility] bariatric surgery program coordinator on 04/10/2019.   Referral stated Supervised Weight Loss (SWL) visits needed: 0  Planned surgery: Sleeve Pt expectation of surgery: to lose significant weight (more than 20-30 lbs), relieve knee pain, be healthier, come off medication, lower blood pressure and take stress off of heart    NUTRITION ASSESSMENT   Anthropometrics  Start weight at [facility]: 293.5 lbs (date: 04/10/2019) Height: 65 in BMI: 48.8 kg/m2    Clinical  Medical hx: obesity, PCOS, arthritis  Medications: metformin, mirena, prilosec   Lifestyle & Dietary Hx (living situation, sleep regimen, functional ability, weight hx, current dietary patterns, fluid intake, supplements, physical activity, etc) Works as a Optometrist. Lives with her husband and their 3 young children. Knows someone who has had the Sleeve before.   Typical meal patten is 2-3 meals/day and snacks on candy in between. Eats out often, including foods such as a burger, [employer]ipotle, [place], spaghetti, Jimmy [employer]'s, [employer], Chicken Salad Chick. Will typically have a protein shake for breakfast and maybe for lunch as well if she doesn't go out and get something. Drinks lots of water throughout the day.    24-Hr Dietary Recall First Meal: protein shake  Snack: mini candy bar  Second Meal: [employer] sandwich  Snack: mini candy bar Third Meal: chicken + baked sweet potato + salad + bread  Snack: - Beverages: water, half and half tea   Estimated Energy Needs Calories: 1800 Carbohydrate: 200g Protein: 113g Fat: 60g   NUTRITION DIAGNOSIS  Overweight/obesity ([place]-3.3) related to past poor dietary habits and physical inactivity as evidenced by patient w/ planned Sleeve  Gastrectomy surgery following dietary guidelines for continued weight loss.    NUTRITION INTERVENTION  Nutrition counseling (C-1) and education (E-2) to facilitate bariatric surgery goals.  Pre-Op Goals Reviewed with the Patient . Track food and beverage intake (pen and paper, MyFitness Pal, Baritastic app, etc.) . Make healthy food choices while monitoring portion sizes . Consume 3 meals per day or try to eat every 3-5 hours . Avoid concentrated sugars and fried foods . Keep sugar & fat in the single digits per serving on food labels . Practice CHEWING your food (aim for applesauce consistency) . Practice not drinking 15 minutes before, during, and 30 minutes after each meal and snack . Avoid all carbonated beverages (ex: soda, sparkling beverages)  . Limit caffeinated beverages (ex: coffee, tea, energy drinks) . Avoid all sugar-sweetened beverages (ex: regular soda, sports drinks)  . Avoid alcohol  . Aim for 64-100 ounces of FLUID daily (with at least half of fluid intake being plain water)  . Aim for at least 60-80 grams of PROTEIN daily . Look for a liquid protein source that contains ?15 g protein and ?5 g carbohydrate (ex: shakes, drinks, shots) . Make a list of non-food related activities . Physical activity is an important part of a healthy lifestyle so keep it moving! The goal is to reach 150 minutes of exercise per week, including cardiovascular and weight baring activity.  Handouts Provided Include  . Bariatric Surgery handouts (Nutrition Visits, Pre-Op Goals, Protein Shakes, Vitamins & Minerals)  Learning Style & Readiness for Change Teaching method utilized: Visual & Auditory  Demonstrated degree of understanding via: Teach Back  Barriers to learning/adherence to lifestyle change: None Identified  MONITORING & EVALUATION Dietary intake, weekly physical activity, body weight, and pre-op goals reached at next nutrition visit.   Next Steps Patient is to call [facility] to  enroll in Pre-Op Class (>2 weeks before surgery) and Post-Op Class (2 weeks after surgery) for further nutrition education when surgery date is scheduled.

## 2019-08-24 ENCOUNTER — Ambulatory Visit: Payer: Self-pay

## 2019-08-24 ENCOUNTER — Other Ambulatory Visit: Payer: Self-pay

## 2019-08-24 ENCOUNTER — Ambulatory Visit: Payer: 59 | Admitting: Family Medicine

## 2019-08-24 ENCOUNTER — Encounter: Payer: Self-pay | Admitting: Family Medicine

## 2019-08-24 VITALS — BP 137/89 | Ht 65.0 in | Wt 279.0 lb

## 2019-08-24 DIAGNOSIS — M7582 Other shoulder lesions, left shoulder: Secondary | ICD-10-CM | POA: Diagnosis not present

## 2019-08-24 DIAGNOSIS — M25512 Pain in left shoulder: Secondary | ICD-10-CM

## 2019-08-24 MED ORDER — NITROGLYCERIN 0.2 MG/HR TD PT24: MEDICATED_PATCH | TRANSDERMAL | 1 refills | Status: DC

## 2019-08-24 NOTE — Progress Notes (Addendum)
    SUBJECTIVE:   CHIEF COMPLAINT / HPI:  Patient presenting as referral from PCP for left shoulder pain that has been present since 2017.  At that time, she started noticing having some pain with workouts especially with plank or downward dog.  Over time it became worse and now she has trouble lifting her arm while it is bent at the elbow such as pulling up her pants or lifting groceries.  Patient reports that the pain has been stable and not worsened or improved since 2017.  Patient denies any history of injury or trauma to the area.  Patient reports that she has been to [facility] in 2019 and had an MRI that showed some bony changes but was not available for review today.  She also had cortisone injection without any relief.  Patient also did 2 sessions of physical therapy and continues to do exercises at home.  She has had months where she does not do exercises and has rested the area without any relief.  Past medical history is significant for PCOS currently on Metformin.  No history of diabetes.  OBJECTIVE:   BP 137/89   Ht 5\' 5"  (1.651 m)   Wt 279 lb (126.6 kg)   BMI 46.43 kg/m    General: Well-appearing female, no acute distress  Cervical spine: No gross abnormalities. No TTP of bony prominences. Full active and passive ROM. 5/5 strength.   Shoulder Exam: No gross abnormalities, symmetric muscle bulk and appearance.  Mild tenderness to palpation at anterior humeral head, but no tenderness noted elsewhere.  No tenderness to palpation of major muscle groups.  Patient has full passive range of motion with external rotation but limited with active ER on left side.  Strength 5/5 empty can, IR, ER.  Mild pain empty can and ER.  Special tests: Empty can positive, Hawkins positive, Neer's negative, liftoff produces pain.  MSK u/s left shoulder: Biceps tendon intact in long and short axis without tenosynovitis Pec major tendon intact to insertion Subscapularis without tears or apparent  impingement AC joint without effusion or arthropathy Posterior glenohumeral joint without effusion, labral tear Infraspinatus hypoechoic but no evidence apparent tear Supraspinatus hypoechoic but no anechoic areas consistent with tear.  No subacromial bursitis.  ASSESSMENT/PLAN:  1.  Left shoulder tendinopathy Patient's ultrasound is significant for tendinopathy of supraspinatus and infraspinatus.  There are no obvious tears observed.  Encourage patient to continue with conservative management which includes formal physical therapy, at home exercises to strengthen shoulder girdle, nitro patches - discussed risks of headache, skin irritation.  NSAIDs prn for pain.  Given patient's chronic shoulder issues, will would not expect quick improvement with physical therapy.  If there is no improvement in 4 to 6 months with conservative treatment, patient to return to care for further evaluation and management.  , MD [facility]   Addendum:  Patient seen and examined with resident.  Agree with her note and findings.  Patient's ultrasound is reassuring but consistent with chronic rotator cuff tendinopathy.  Start formal PT and nitro patches.  Icing, aleve or ibuprofen if needed.  F/u in 6 weeks.  Addendum:  Notes from [facility] reviewed - MRI from 1.5 years ago showed severe supraspinatus tendinopathy with edema in superior facet at insertion.  Consistent with current ultrasound findings.  They tried subacromial injection, a couple visits of PT.  Advised patient to continue with treatment plan we outlined above - she's tolerating nitro patches well.

## 2019-08-24 NOTE — Patient Instructions (Signed)
Your ultrasound is reassuring. You have rotator cuff tendinopathy. Try to avoid painful activities (overhead activities, lifting with extended arm) as much as possible. Aleve or ibuprofen as needed. Can take tylenol in addition to this. Nitro patches 1/4th patch to affected area, change daily. Subacromial injection may be beneficial to help with pain and to decrease inflammation if not improving. Start physical therapy with transition to home exercise program. Do home exercise program with theraband and scapular stabilization exercises daily 3 sets of 10 once a day. If not improving at follow-up we will consider further imaging, injection, physical therapy. Follow up with me in 6 weeks.

## 2019-08-31 ENCOUNTER — Encounter: Payer: Self-pay | Admitting: Family Medicine

## 2019-10-07 ENCOUNTER — Ambulatory Visit: Payer: 59 | Admitting: Family Medicine

## 2019-10-07 ENCOUNTER — Other Ambulatory Visit: Payer: Self-pay

## 2019-10-07 ENCOUNTER — Encounter: Payer: Self-pay | Admitting: Family Medicine

## 2019-10-07 VITALS — BP 135/75 | Ht 65.0 in | Wt 262.0 lb

## 2019-10-07 DIAGNOSIS — M25512 Pain in left shoulder: Secondary | ICD-10-CM

## 2019-10-07 MED ORDER — NITROGLYCERIN 0.2 MG/HR TD PT24: MEDICATED_PATCH | TRANSDERMAL | 2 refills | Status: DC

## 2019-10-07 NOTE — Progress Notes (Signed)
PCP: Glenis Smoker, MD  Subjective:   HPI: Patient is a 39 y.o. female here for left shoulder pain.  3/15: Patient presenting as referral from PCP for left shoulder pain that has been present since 2017.  At that time, she started noticing having some pain with workouts especially with plank or downward dog.  Over time it became worse and now she has trouble lifting her arm while it is bent at the elbow such as pulling up her pants or lifting groceries.  Patient reports that the pain has been stable and not worsened or improved since 2017.  Patient denies any history of injury or trauma to the area.  Patient reports that she has been to [facility] in 2019 and had an MRI that showed some bony changes but was not available for review today.  She also had cortisone injection without any relief.  Patient also did 2 sessions of physical therapy and continues to do exercises at home.  She has had months where she does not do exercises and has rested the area without any relief.  Past medical history is significant for PCOS currently on Metformin.  No history of diabetes.  4/28: Patient reports she's a little better compared to last visit. Able to reach behind and internally rotate better. Has been doing physical therapy and now transitioned to home exercises. Bothers to lift arm overhead. Tolerating nitro patches also. No new injuries.  Past Medical History:  Diagnosis Date  . Acute sinusitis   . Cloudy urine    Urine culture negative 05/2013  . Female infertility    clomid with the pregnancy  . History of gestational hypertension   . Irregular periods   . Leukorrhea   . Morbid obesity ([place])   . Polycystic ovaries    PCOS- on metformin  . Tachycardia   . Twin pregnancy    Di/Di growth U/S every 4 weeks  . Urinary tract infectious disease   . Vaginal Pap smear, abnormal     Current Outpatient Medications on File Prior to Visit  Medication Sig Dispense Refill  .  Levonorgestrel (MIRENA, 52 MG, IU) Mirena    . metFORMIN (GLUCOPHAGE-XR) 500 MG 24 hr tablet Take 500 mg by mouth 2 (two) times daily.     No current facility-administered medications on file prior to visit.    Past Surgical History:  Procedure Laterality Date  . CESAREAN SECTION N/A 01/10/2014   Procedure: CESAREAN SECTION;  Surgeon: Betsy Coder, MD;  Location: [facility];  Service: Obstetrics;  Laterality: N/A;  . CESAREAN SECTION N/A 04/16/2017   Procedure: Repeat CESAREAN SECTION;  Surgeon: Everett Graff, MD;  Location: [place];  Service: Obstetrics;  Laterality: N/A;  RNFA Needed. Jill Side to RNFA  . COLPOSCOPY    . ENDOMETRIAL BIOPSY     11/2012, GYN    No Known Allergies  Social History   Socioeconomic History  . Marital status: Married    Spouse name: Not on file  . Number of children: Not on file  . Years of education: Not on file  . Highest education level: Not on file  Occupational History  . Not on file  Tobacco Use  . Smoking status: Never Smoker  . Smokeless tobacco: Never Used  Substance and Sexual Activity  . Alcohol use: No  . Drug use: No  . Sexual activity: Yes  Other Topics Concern  . Not on file  Social History Narrative  . Not on file  Social Determinants of Health   Financial Resource Strain:   . Difficulty of Paying Living Expenses:   Food Insecurity:   . Worried About Programme researcher, broadcasting/film/video in the Last Year:   . Barista in the Last Year:   Transportation Needs:   . Freight forwarder (Medical):   Marland Kitchen Lack of Transportation (Non-Medical):   Physical Activity:   . Days of Exercise per Week:   . Minutes of Exercise per Session:   Stress:   . Feeling of Stress :   Social Connections:   . Frequency of Communication with Friends and Family:   . Frequency of Social Gatherings with Friends and Family:   . Attends Religious Services:   . Active Member of Clubs or Organizations:   . Attends Banker Meetings:    Marland Kitchen Marital Status:   Intimate Partner Violence:   . Fear of Current or Ex-Partner:   . Emotionally Abused:   Marland Kitchen Physically Abused:   . Sexually Abused:     Family History  Problem Relation Age of Onset  . Breast cancer Mother 32  . Hypertension Mother   . Hypertension Father     BP 135/75   Ht 5\' 5"  (1.651 m)   Wt 262 lb (118.8 kg)   BMI 43.60 kg/m   Review of Systems: See HPI above.     Objective:  Physical Exam:  Gen: NAD, comfortable in exam room  Left shoulder: No swelling, ecchymoses.  No gross deformity. No TTP. FROM. Mild positive Hawkins, negative Neers. Negative Yergasons. Strength 5/5 with empty can and resisted internal/external rotation. NV intact distally.   Assessment & Plan:  1. Left shoulder pain - 2/2 chronic rotator cuff tendinopathy of supraspinatus.  Mild improvement to date.  Continue home exercises.  Done with PT at this point.  Increase nitro to 1/2 patch.  Consider repeating MRI, subacromial injection if not improving.  F/u in 3 months.

## 2019-10-07 NOTE — Patient Instructions (Addendum)
Increase the nitro patches to 1/2 patch and change daily. Continue home exercises daily for next 4-6 weeks at least - then you can back down to 2-3 times a week. Consider repeating MRI, steroid injection as next steps if you're struggling. Follow up with me in 3 months but call me sooner if you're struggling.

## 2021-02-15 ENCOUNTER — Other Ambulatory Visit: Payer: Self-pay

## 2021-02-15 ENCOUNTER — Ambulatory Visit: Payer: BC Managed Care – PPO | Admitting: Family Medicine

## 2021-02-15 ENCOUNTER — Ambulatory Visit: Payer: 59 | Admitting: Family Medicine

## 2021-02-15 VITALS — Ht 65.0 in | Wt 250.0 lb

## 2021-02-15 DIAGNOSIS — M25562 Pain in left knee: Secondary | ICD-10-CM | POA: Diagnosis not present

## 2021-02-15 NOTE — Progress Notes (Signed)
PCP: Shon Hale, MD  Subjective:   HPI: Patient is a 40 y.o. female here for left knee pain.  Seen today for evaluation of left knee pain present for the past month.  She denies inciting event or trauma that time.  She has been exercising regularly but denies starting any new exercises recently or changing her exercise routine. Describes pain just above her patella.  Pain is most notable when extending her leg or climbing stairs. She periodically has been unable to fully extend her leg because it feels as if her patella is out of place.  This seems to quickly resolve on its own. She has been taking ibuprofen as needed for pain relief Her past medical history is significant for bilateral knee osteoarthritis but she states that this pain is different than what she experienced previously.  Past Medical History:  Diagnosis Date   Acute sinusitis    Cloudy urine    Urine culture negative 05/2013   Female infertility    clomid with the pregnancy   History of gestational hypertension    Irregular periods    Leukorrhea    Morbid obesity ([facility])    Polycystic ovaries    PCOS- on metformin   Tachycardia    Twin pregnancy    Di/Di growth U/S every 4 weeks   Urinary tract infectious disease    Vaginal Pap smear, abnormal     Current Outpatient Medications on File Prior to Visit  Medication Sig Dispense Refill   Levonorgestrel (MIRENA, 52 MG, IU) Mirena     metFORMIN (GLUCOPHAGE-XR) 500 MG 24 hr tablet Take 500 mg by mouth 2 (two) times daily.     nitroGLYCERIN (NITRODUR - DOSED IN MG/24 HR) 0.2 mg/hr patch Apply 1/2 patch to affected shoulder, change daily 30 patch 2   No current facility-administered medications on file prior to visit.    Past Surgical History:  Procedure Laterality Date   CESAREAN SECTION N/A 01/10/2014   Procedure: CESAREAN SECTION;  Surgeon: Michael Litter, MD;  Location: [facility];  Service: Obstetrics;  Laterality: N/A;   CESAREAN SECTION N/A 04/16/2017    Procedure: Repeat CESAREAN SECTION;  Surgeon: Osborn Coho, MD;  Location: [facility];  Service: Obstetrics;  Laterality: N/A;  RNFA Needed. Heather K to RNFA   COLPOSCOPY     ENDOMETRIAL BIOPSY     11/2012, GYN    No Known Allergies  Ht 5\' 5"  (1.651 m)   Wt 250 lb (113.4 kg)   BMI 41.60 kg/m   No flowsheet data found.  No flowsheet data found.      Objective:  Physical Exam:  Gen: NAD, comfortable in exam room  Left Knee No gross deformity, ecchymoses, swelling. There is mild discomfort with palpation over the distal quadriceps tendon. FROM with normal strength. There is pain with resisted extension of the knee. Negative ant/post drawers. Negative valgus/varus testing. Negative lachman.  Negative mcmurrays, apleys. NV intact distally.    Assessment & Plan:  Left quadriceps tendinopathy History of bilateral knee osteoarthritis  History and exam findings today are suggestive of quadriceps tendinopathy.  Treatment options discussed.  We recommend starting home PT exercises and taking ibuprofen 3 times daily with meals for the next week.  She can take ibuprofen as needed afterwards.  We also discussed that a knee sleeve or brace may help as well.  For the time being, she should avoid deep squats or lunges.  Icing for 15 minutes as needed should help as well.  We will plan for follow-up in 6 weeks.  If her pain has not improved at that time we can consider adding nitro patches.  Christel Mormon, MD PGY-3  Patient seen and examined with resident.  Agree with his note and findings.

## 2021-02-15 NOTE — Patient Instructions (Signed)
You have quadriceps tendinopathy. Take ibuprofen 600mg  three times a day with food for 7 days then as needed. The rehab exercises are the most important part of treatment - do these daily - if they're not helping enough give me a call and we can put in a referral for formal physical therapy. Icing 15 minutes as needed. No deep squats or lunges if you can avoid them. Knee sleeve or brace when up and walking around may help as well. Follow up with me in 6 weeks.

## 2021-02-24 DIAGNOSIS — E282 Polycystic ovarian syndrome: Secondary | ICD-10-CM | POA: Diagnosis not present

## 2021-02-24 DIAGNOSIS — R7303 Prediabetes: Secondary | ICD-10-CM | POA: Diagnosis not present

## 2021-02-24 DIAGNOSIS — Z23 Encounter for immunization: Secondary | ICD-10-CM | POA: Diagnosis not present

## 2021-02-24 DIAGNOSIS — Z0000 Encounter for general adult medical examination without abnormal findings: Secondary | ICD-10-CM | POA: Diagnosis not present

## 2021-02-24 DIAGNOSIS — E7800 Pure hypercholesterolemia, unspecified: Secondary | ICD-10-CM | POA: Diagnosis not present

## 2021-05-29 DIAGNOSIS — H40013 Open angle with borderline findings, low risk, bilateral: Secondary | ICD-10-CM | POA: Diagnosis not present

## 2021-08-12 IMAGING — RF DG UGI W SINGLE CM
7 of 8 series · 15 of 20 positions shown · non-contrast
Comparison: None.

CLINICAL DATA: Preop gastric sleeve

EXAM:
UPPER GI SERIES WITH KUB
TECHNIQUE: After obtaining a scout radiograph a routine upper GI series was
performed using thin barium
FLUOROSCOPY TIME:  Fluoroscopy Time:  1 minutes 30 seconds
Radiation Exposure Index (if provided by the fluoroscopic device):
61.6 mGy
Number of Acquired Spot Images: 6

[Series 1: t abdomen supine · 0.15mm/px · 1 of 1 slices shown]
[im 1/1]
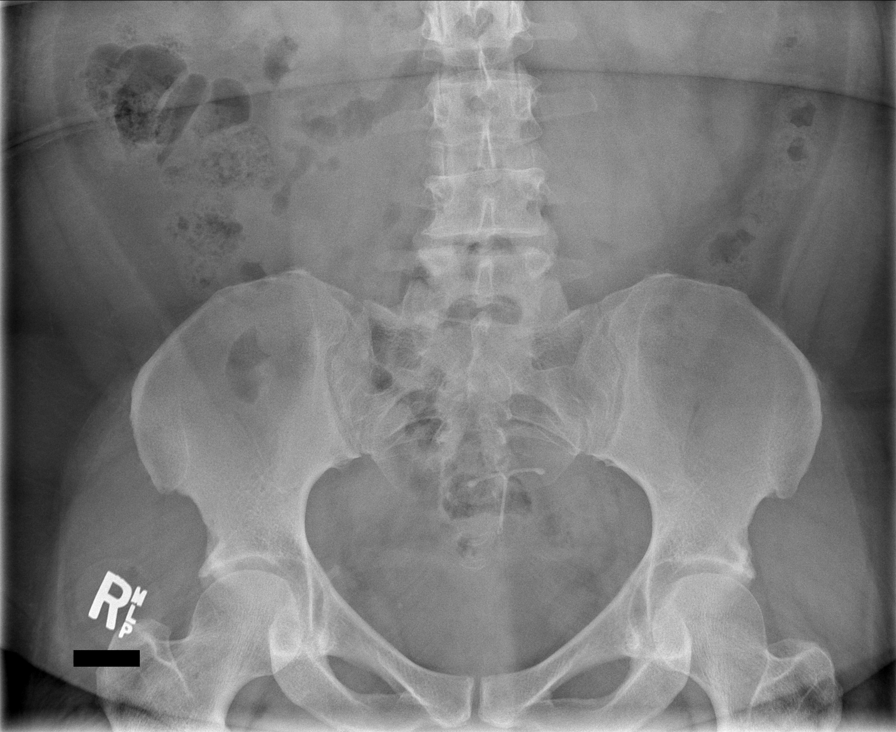

[Series 3: cp_standard · 0.51mm/px · 3 of 88 frames shown (1 of 5)]
[frame 5/88]
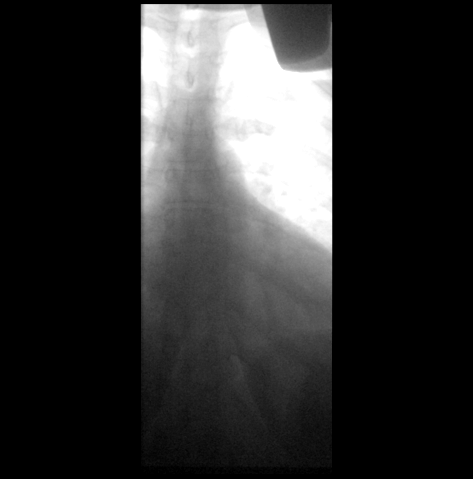
[frame 14/88]
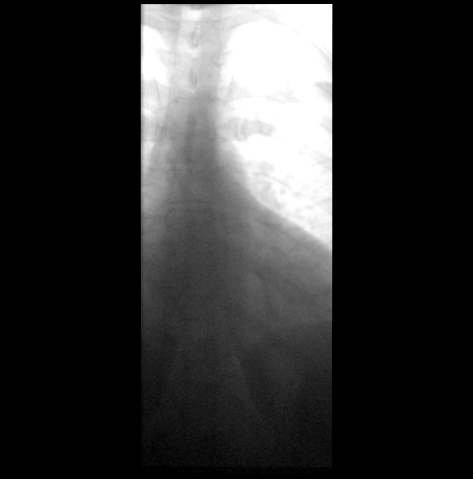
[frame 45/88]
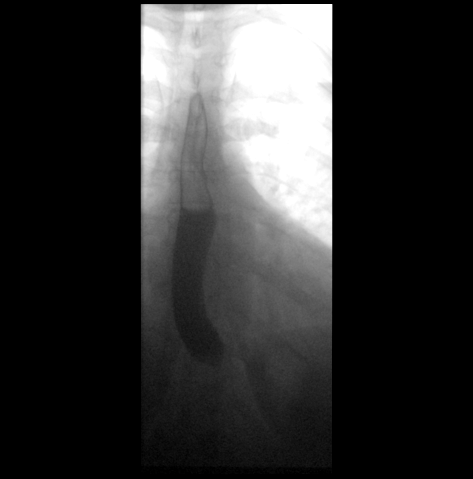

[Series 4: cp_standard · 0.51mm/px · 3 of 157 frames shown (2 of 5)]
[frame 24/157]
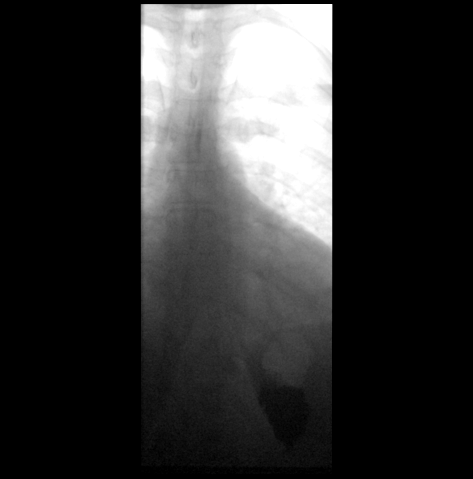
[frame 30/157]
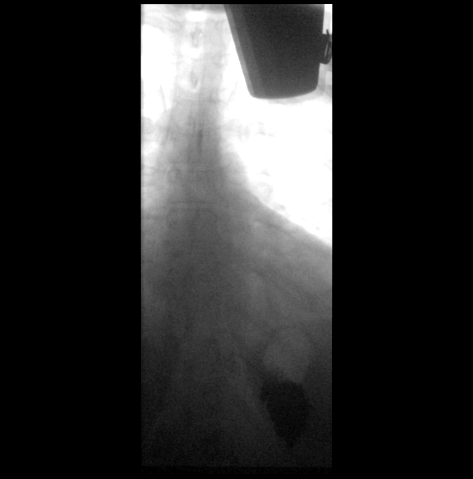
[frame 79/157]
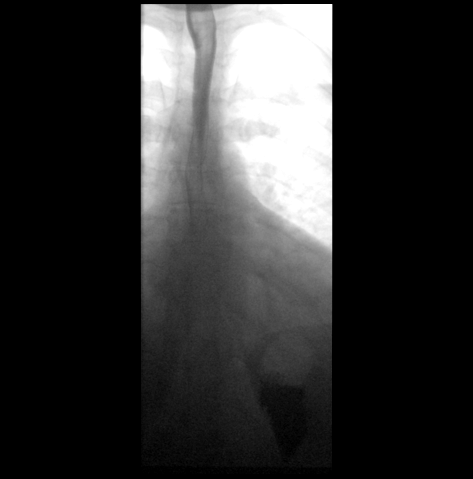

[Series 5: cp_standard · 0.51mm/px · 3 of 42 frames shown (3 of 5)]
[frame 3/42]
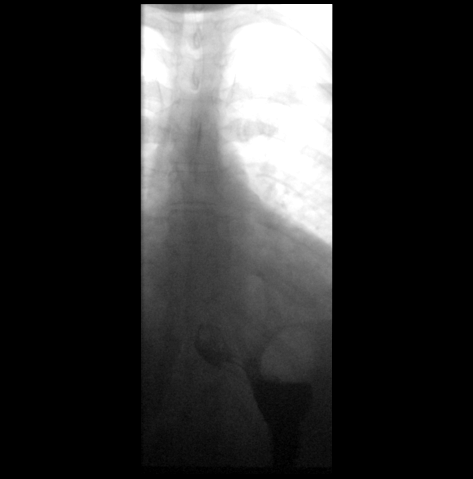
[frame 7/42]
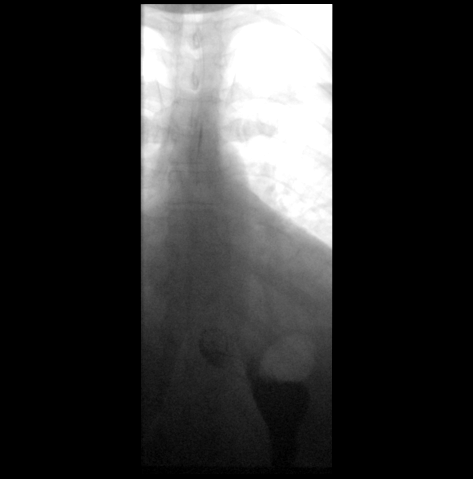
[frame 22/42]
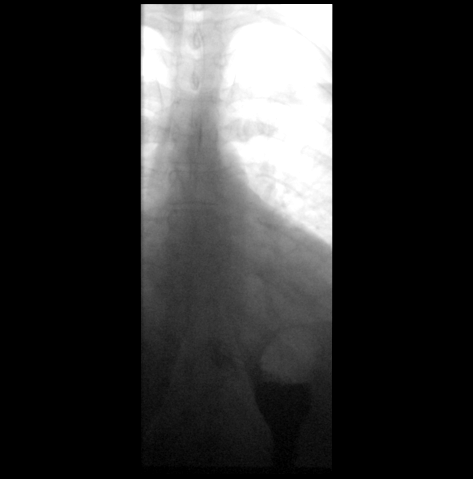

[Series 6: cp_standard · 0.17mm/px · 1 of 1 slices shown (4 of 5)]
[im 1/1]
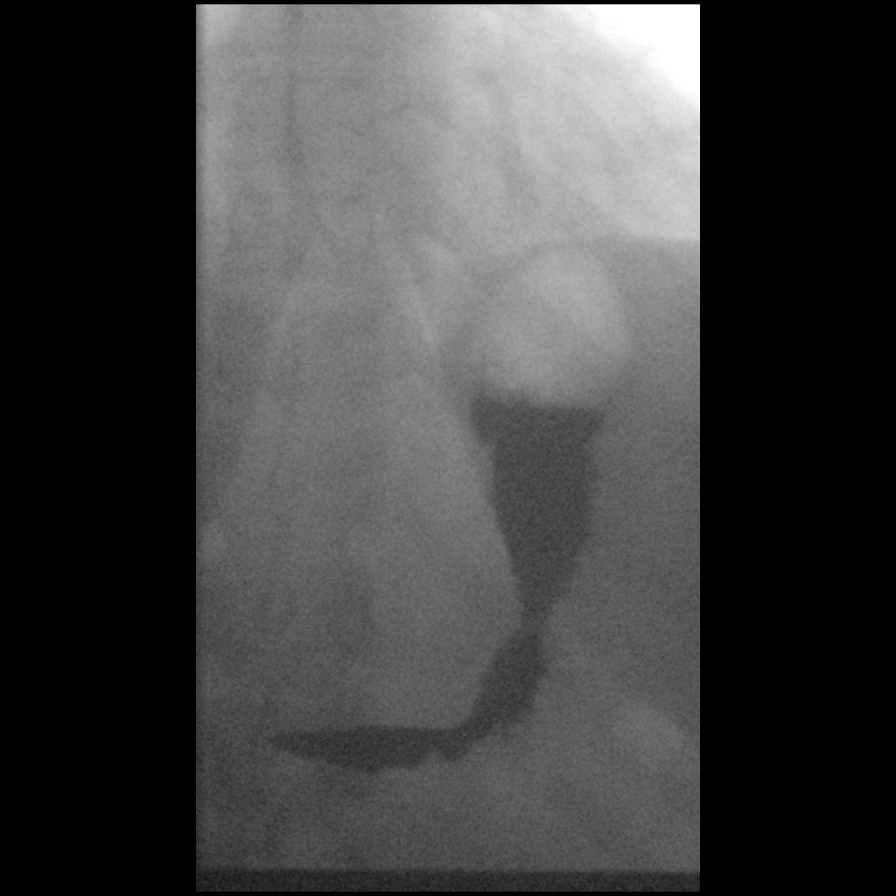

[Series 7: cp_standard · 0.34mm/px · 3 of 86 frames shown (5 of 5)]
[frame 13/86]
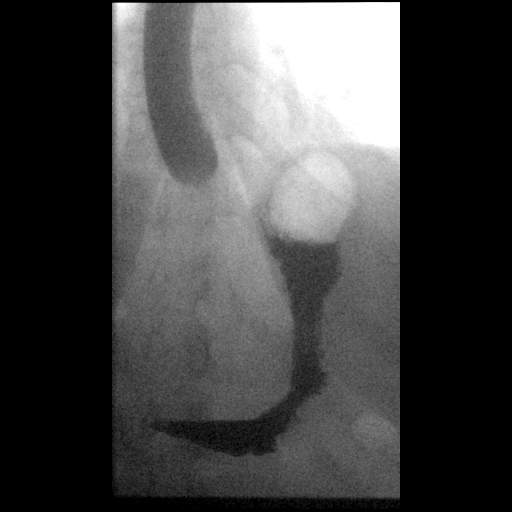
[frame 19/86]
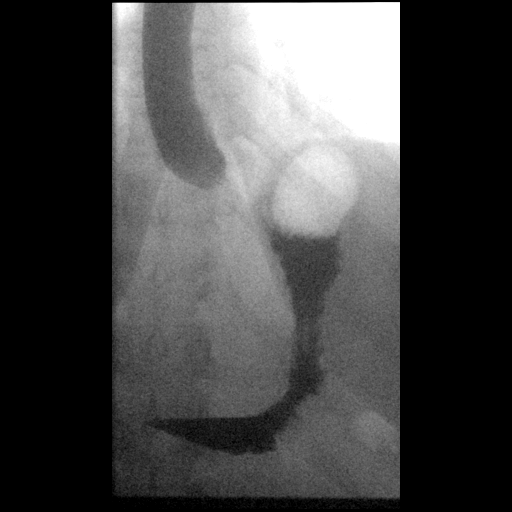
[frame 74/86]
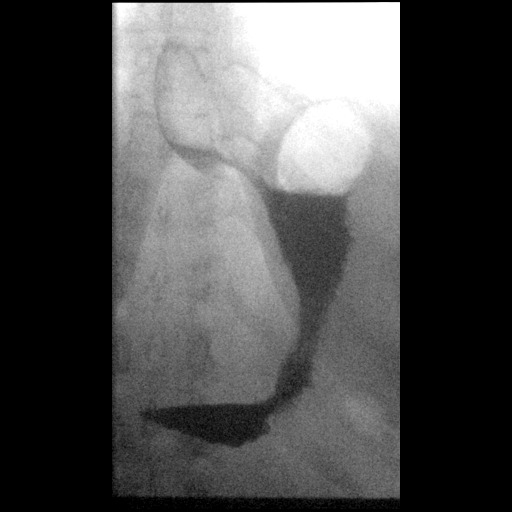

[Series 8: fluoro_barium 2fps_bw · 0.17mm/px · 1 of 1 slices shown]
[im 1/1]
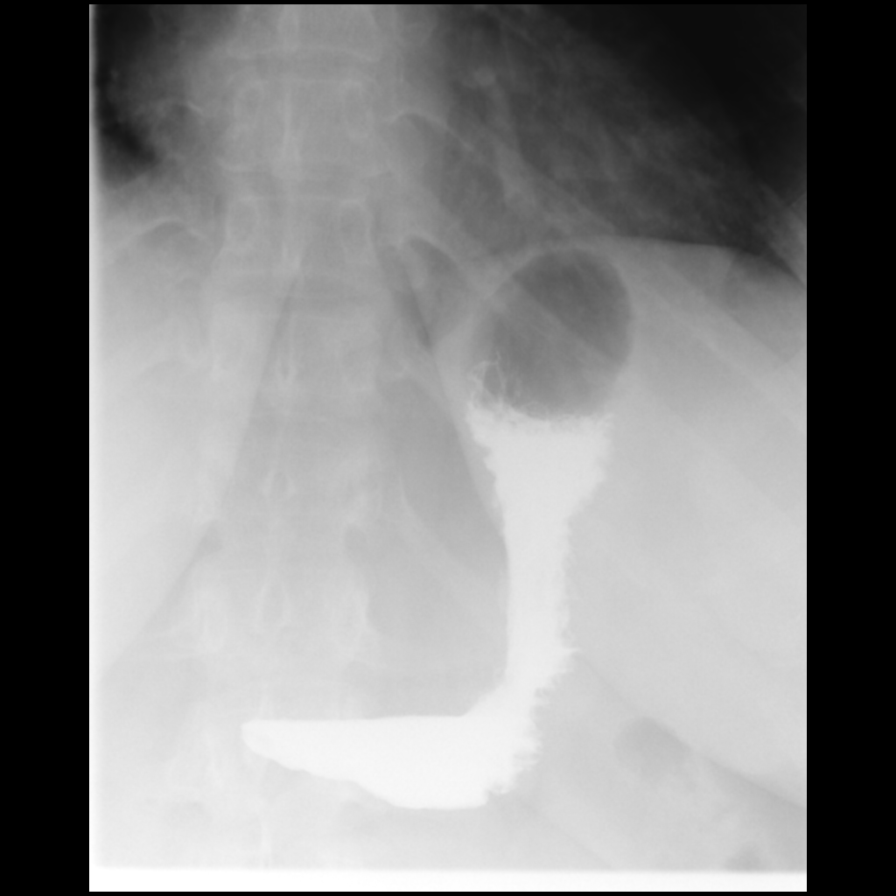

[15 of 20 positions shown; findings below may reference images not displayed]

FINDINGS: Normal esophageal peristalsis.

No fixed esophageal narrowing or stricture.

No evidence of hiatal hernia.

Normal gastric folds.
IMPRESSION: Negative single-contrast upper GI.

## 2021-10-24 DIAGNOSIS — Z803 Family history of malignant neoplasm of breast: Secondary | ICD-10-CM | POA: Diagnosis not present

## 2021-10-24 DIAGNOSIS — Z1231 Encounter for screening mammogram for malignant neoplasm of breast: Secondary | ICD-10-CM | POA: Diagnosis not present

## 2021-10-24 DIAGNOSIS — Z01419 Encounter for gynecological examination (general) (routine) without abnormal findings: Secondary | ICD-10-CM | POA: Diagnosis not present

## 2021-10-24 DIAGNOSIS — Z124 Encounter for screening for malignant neoplasm of cervix: Secondary | ICD-10-CM | POA: Diagnosis not present

## 2021-12-04 DIAGNOSIS — M9902 Segmental and somatic dysfunction of thoracic region: Secondary | ICD-10-CM | POA: Diagnosis not present

## 2021-12-04 DIAGNOSIS — M9901 Segmental and somatic dysfunction of cervical region: Secondary | ICD-10-CM | POA: Diagnosis not present

## 2021-12-04 DIAGNOSIS — M9903 Segmental and somatic dysfunction of lumbar region: Secondary | ICD-10-CM | POA: Diagnosis not present

## 2022-02-28 DIAGNOSIS — E282 Polycystic ovarian syndrome: Secondary | ICD-10-CM | POA: Diagnosis not present

## 2022-02-28 DIAGNOSIS — Z23 Encounter for immunization: Secondary | ICD-10-CM | POA: Diagnosis not present

## 2022-02-28 DIAGNOSIS — Z0000 Encounter for general adult medical examination without abnormal findings: Secondary | ICD-10-CM | POA: Diagnosis not present

## 2022-02-28 DIAGNOSIS — E7800 Pure hypercholesterolemia, unspecified: Secondary | ICD-10-CM | POA: Diagnosis not present

## 2022-02-28 DIAGNOSIS — R7303 Prediabetes: Secondary | ICD-10-CM | POA: Diagnosis not present

## 2022-08-27 DIAGNOSIS — H40013 Open angle with borderline findings, low risk, bilateral: Secondary | ICD-10-CM | POA: Diagnosis not present

## 2022-12-06 DIAGNOSIS — Z01411 Encounter for gynecological examination (general) (routine) with abnormal findings: Secondary | ICD-10-CM | POA: Diagnosis not present

## 2022-12-06 DIAGNOSIS — Z6841 Body Mass Index (BMI) 40.0 and over, adult: Secondary | ICD-10-CM | POA: Diagnosis not present

## 2022-12-06 DIAGNOSIS — Z01419 Encounter for gynecological examination (general) (routine) without abnormal findings: Secondary | ICD-10-CM | POA: Diagnosis not present

## 2022-12-06 DIAGNOSIS — Z1239 Encounter for other screening for malignant neoplasm of breast: Secondary | ICD-10-CM | POA: Diagnosis not present

## 2022-12-06 DIAGNOSIS — Z1231 Encounter for screening mammogram for malignant neoplasm of breast: Secondary | ICD-10-CM | POA: Diagnosis not present

## 2023-01-09 ENCOUNTER — Ambulatory Visit: Payer: BC Managed Care – PPO | Admitting: Family Medicine

## 2023-01-09 ENCOUNTER — Encounter: Payer: Self-pay | Admitting: Family Medicine

## 2023-01-09 VITALS — BP 136/86 | Ht 65.0 in | Wt 270.0 lb

## 2023-01-09 DIAGNOSIS — M67912 Unspecified disorder of synovium and tendon, left shoulder: Secondary | ICD-10-CM

## 2023-01-09 MED ORDER — NITROGLYCERIN 0.2 MG/HR TD PT24: MEDICATED_PATCH | TRANSDERMAL | 2 refills | Status: AC

## 2023-01-09 NOTE — Patient Instructions (Addendum)
You have rotator cuff tendinopathy. Try to avoid painful activities (overhead activities, lifting with extended arm) as much as possible. Aleve or ibuprofen as needed. Can take tylenol in addition to this. Nitro patches 1/4 -1/2 patch to affected area, change daily. Subacromial injection may be beneficial to help with pain and to decrease inflammation if not improving. Do home exercise program with theraband and scapular stabilization exercises daily 3 sets of 10 once a day. If not improving at follow-up we will consider further imaging, injection, physical therapy. F/u in 6-8 weeks.

## 2023-01-09 NOTE — Progress Notes (Signed)
PCP: Shon Hale, MD  Subjective:   HPI: Patient is a right-hand-dominant 42 y.o. female here for evaluation of left shoulder pain.  She has a history of left supraspinatus tendinopathy back in 2021 that improved with PT and nitroglycerin protocol.  She reports that over the past 4 to 6 months she had recurrent onset of similar symptoms though these are slightly different than prior with pain being more lateral in the shoulder.  No inciting injury or trauma.  She does identify positive weakness.  She has pain with sleeping.  Past Medical History:  Diagnosis Date   Acute sinusitis    Cloudy urine    Urine culture negative 05/2013   Female infertility    clomid with the pregnancy   History of gestational hypertension    Irregular periods    Leukorrhea    Morbid obesity ([facility])    Polycystic ovaries    PCOS- on metformin   Tachycardia    Twin pregnancy    Di/Di growth U/S every 4 weeks   Urinary tract infectious disease    Vaginal Pap smear, abnormal     Current Outpatient Medications on File Prior to Visit  Medication Sig Dispense Refill   Levonorgestrel (MIRENA, 52 MG, IU) Mirena     metFORMIN (GLUCOPHAGE-XR) 500 MG 24 hr tablet Take 500 mg by mouth 2 (two) times daily.     No current facility-administered medications on file prior to visit.    Past Surgical History:  Procedure Laterality Date   CESAREAN SECTION N/A 01/10/2014   Procedure: CESAREAN SECTION;  Surgeon: Michael Litter, MD;  Location: [facility];  Service: Obstetrics;  Laterality: N/A;   CESAREAN SECTION N/A 04/16/2017   Procedure: Repeat CESAREAN SECTION;  Surgeon: Osborn Coho, MD;  Location: [facility];  Service: Obstetrics;  Laterality: N/A;  RNFA Needed. Heather K to RNFA   COLPOSCOPY     ENDOMETRIAL BIOPSY     11/2012, GYN    No Known Allergies  BP 136/86 (BP Location: Right Arm, Patient Position: Sitting)   Ht 5\' 5"  (1.651 m)   Wt 270 lb (122.5 kg)   BMI 44.93 kg/m       No data to  display              No data to display              Objective:  Physical Exam:  Gen: NAD, comfortable in exam room Resp: Breathing comfortably, symmetric chest rise  MSK Left Shoulder: No swelling, ecchymoses.  No gross deformity. No TTP at [facility] joint, anterior, lateral or posterior shoulder. FROM. Positive Hawkins, negative Neers. Positive Speeds, Negative Yergasons. Strength 4+/5 with empty can (+pain) and 5/5 with resisted internal/external rotation. NV intact distally.   Assessment & Plan:  1. Tendinopathy of left rotator cuff History and exam consistent with tendinopathy of supraspinatus and probably also biceps tendinosis.  Patient previously got great benefit from both physical therapy and nitroglycerin protocol and would like to start treatment here again.  Reviewed side effects to monitor for with nitroglycerin such as headaches, lightheadedness/hypotension, and skin irritation.  Provided home exercise program and Thera-Band in addition to Rx for nitroglycerin 1/2 patch daily. PRN NSAIDS and tylenol. Follow-up in 6 weeks to reevaluate.  Glean Salen, MD PGY4, Sports Medicine Fellow [facility]

## 2023-01-11 ENCOUNTER — Encounter: Payer: Self-pay | Admitting: Family Medicine

## 2023-01-31 DIAGNOSIS — A499 Bacterial infection, unspecified: Secondary | ICD-10-CM | POA: Diagnosis not present

## 2023-02-18 ENCOUNTER — Ambulatory Visit: Payer: BC Managed Care – PPO | Admitting: Family Medicine

## 2023-02-18 ENCOUNTER — Other Ambulatory Visit: Payer: Self-pay

## 2023-02-18 VITALS — BP 135/77 | Ht 65.0 in | Wt 280.0 lb

## 2023-02-18 DIAGNOSIS — M67912 Unspecified disorder of synovium and tendon, left shoulder: Secondary | ICD-10-CM

## 2023-02-18 MED ORDER — METHYLPREDNISOLONE ACETATE 40 MG/ML IJ SUSP
40.0000 mg | Freq: Once | INTRAMUSCULAR | Status: AC
  Administered 2023-02-18: 40 mg via INTRA_ARTICULAR

## 2023-02-18 NOTE — Progress Notes (Unsigned)
PCP: Shon Hale, MD  Subjective:   HPI: Patient is a 42 y.o. female here for rotator cuff tendinopathy of left shoulder.  Patient seen 01/09/2023, for rotator cuff tendinopathy, treated with at home exercises and Nitropatch, NSAIDs as needed.  Today: Patient reports shoulder pain is slightly better.  Patient reports she does the sizes 2 to 3 days a week, and goes to the gym.  Patient reports the nitro patch is helping, she tried to have a patch but got a headache and stayed with a quarter of a patch.  Patient mostly having problems with sleeping as shoulder pain keeps her from falling asleep on either side as she lays on.  Patient otherwise having good ability/use and shoulder.  Past Medical History:  Diagnosis Date   Acute sinusitis    Cloudy urine    Urine culture negative 05/2013   Female infertility    clomid with the pregnancy   History of gestational hypertension    Irregular periods    Leukorrhea    Morbid obesity (HCC)    Polycystic ovaries    PCOS- on metformin   Tachycardia    Twin pregnancy    Di/Di growth U/S every 4 weeks   Urinary tract infectious disease    Vaginal Pap smear, abnormal     Current Outpatient Medications on File Prior to Visit  Medication Sig Dispense Refill   Levonorgestrel (MIRENA, 52 MG, IU) Mirena     metFORMIN (GLUCOPHAGE-XR) 500 MG 24 hr tablet Take 500 mg by mouth 2 (two) times daily.     nitroGLYCERIN (NITRODUR - DOSED IN MG/24 HR) 0.2 mg/hr patch Apply 1/2 patch to affected shoulder, change daily 30 patch 2   No current facility-administered medications on file prior to visit.    Past Surgical History:  Procedure Laterality Date   CESAREAN SECTION N/A 01/10/2014   Procedure: CESAREAN SECTION;  Surgeon: Michael Litter, MD;  Location: [facility];  Service: Obstetrics;  Laterality: N/A;   CESAREAN SECTION N/A 04/16/2017   Procedure: Repeat CESAREAN SECTION;  Surgeon: Osborn Coho, MD;  Location: [facility];  Service:  Obstetrics;  Laterality: N/A;  RNFA Needed. Heather K to RNFA   COLPOSCOPY     ENDOMETRIAL BIOPSY     11/2012, GYN    No Known Allergies  There were no vitals taken for this visit.      No data to display              No data to display              Objective:  Physical Exam: Gen: NAD, comfortable in exam room No ecchymosis, effusion, erythema or deformity, FROM, Negative Neers, negative empty can test, negative drop arm, positive hawkins test, negative Yergason's    Assessment & Plan:  1. Shoulder impingement Patient continues to have left-sided shoulder pain despite nitroglycerin use and at home exercises, but reports pain is slightly improved.  Patient open to physical therapy, and steroid injection.  Patient exam most concerning for shoulder impingement, versus rotator cuff tear.  Given issues with sleep patient requesting steroid injection. - Steroid injection in left shoulder - PT referral - Follow-up 6 weeks as needed

## 2023-02-18 NOTE — Patient Instructions (Signed)
You have rotator cuff tendinopathy. Try to avoid painful activities (overhead activities, lifting with extended arm) as much as possible. Aleve or ibuprofen as needed. Can take tylenol in addition to this. You were given a subacromial injection today. Start physical therapy and do home exercises on days you don't go to therapy. Follow up with [place] in 5-6 weeks.

## 2023-02-27 DIAGNOSIS — R051 Acute cough: Secondary | ICD-10-CM | POA: Diagnosis not present

## 2023-03-04 DIAGNOSIS — M545 Low back pain, unspecified: Secondary | ICD-10-CM | POA: Diagnosis not present

## 2023-03-04 DIAGNOSIS — M79669 Pain in unspecified lower leg: Secondary | ICD-10-CM | POA: Diagnosis not present

## 2023-03-04 DIAGNOSIS — M25519 Pain in unspecified shoulder: Secondary | ICD-10-CM | POA: Diagnosis not present

## 2023-03-11 DIAGNOSIS — Z0000 Encounter for general adult medical examination without abnormal findings: Secondary | ICD-10-CM | POA: Diagnosis not present

## 2023-03-11 DIAGNOSIS — E7800 Pure hypercholesterolemia, unspecified: Secondary | ICD-10-CM | POA: Diagnosis not present

## 2023-03-11 DIAGNOSIS — M545 Low back pain, unspecified: Secondary | ICD-10-CM | POA: Diagnosis not present

## 2023-03-11 DIAGNOSIS — M79669 Pain in unspecified lower leg: Secondary | ICD-10-CM | POA: Diagnosis not present

## 2023-03-11 DIAGNOSIS — R7303 Prediabetes: Secondary | ICD-10-CM | POA: Diagnosis not present

## 2023-03-11 DIAGNOSIS — Z23 Encounter for immunization: Secondary | ICD-10-CM | POA: Diagnosis not present

## 2023-03-11 DIAGNOSIS — M25519 Pain in unspecified shoulder: Secondary | ICD-10-CM | POA: Diagnosis not present

## 2023-03-22 DIAGNOSIS — M25519 Pain in unspecified shoulder: Secondary | ICD-10-CM | POA: Diagnosis not present

## 2023-03-22 DIAGNOSIS — M79669 Pain in unspecified lower leg: Secondary | ICD-10-CM | POA: Diagnosis not present

## 2023-03-22 DIAGNOSIS — M545 Low back pain, unspecified: Secondary | ICD-10-CM | POA: Diagnosis not present

## 2023-04-02 DIAGNOSIS — M79669 Pain in unspecified lower leg: Secondary | ICD-10-CM | POA: Diagnosis not present

## 2023-04-02 DIAGNOSIS — M25519 Pain in unspecified shoulder: Secondary | ICD-10-CM | POA: Diagnosis not present

## 2023-04-02 DIAGNOSIS — M545 Low back pain, unspecified: Secondary | ICD-10-CM | POA: Diagnosis not present

## 2023-04-06 DIAGNOSIS — M545 Low back pain, unspecified: Secondary | ICD-10-CM | POA: Diagnosis not present

## 2023-04-06 DIAGNOSIS — M25519 Pain in unspecified shoulder: Secondary | ICD-10-CM | POA: Diagnosis not present

## 2023-04-06 DIAGNOSIS — M79669 Pain in unspecified lower leg: Secondary | ICD-10-CM | POA: Diagnosis not present

## 2023-05-14 DIAGNOSIS — M79669 Pain in unspecified lower leg: Secondary | ICD-10-CM | POA: Diagnosis not present

## 2023-05-14 DIAGNOSIS — M545 Low back pain, unspecified: Secondary | ICD-10-CM | POA: Diagnosis not present

## 2023-05-14 DIAGNOSIS — M25519 Pain in unspecified shoulder: Secondary | ICD-10-CM | POA: Diagnosis not present

## 2023-05-21 DIAGNOSIS — M79669 Pain in unspecified lower leg: Secondary | ICD-10-CM | POA: Diagnosis not present

## 2023-05-21 DIAGNOSIS — M545 Low back pain, unspecified: Secondary | ICD-10-CM | POA: Diagnosis not present

## 2023-05-21 DIAGNOSIS — M25519 Pain in unspecified shoulder: Secondary | ICD-10-CM | POA: Diagnosis not present

## 2023-05-27 DIAGNOSIS — M25519 Pain in unspecified shoulder: Secondary | ICD-10-CM | POA: Diagnosis not present

## 2023-05-27 DIAGNOSIS — M79669 Pain in unspecified lower leg: Secondary | ICD-10-CM | POA: Diagnosis not present

## 2023-05-27 DIAGNOSIS — M545 Low back pain, unspecified: Secondary | ICD-10-CM | POA: Diagnosis not present

## 2023-06-07 DIAGNOSIS — M545 Low back pain, unspecified: Secondary | ICD-10-CM | POA: Diagnosis not present

## 2023-06-07 DIAGNOSIS — M79669 Pain in unspecified lower leg: Secondary | ICD-10-CM | POA: Diagnosis not present

## 2023-06-07 DIAGNOSIS — M25519 Pain in unspecified shoulder: Secondary | ICD-10-CM | POA: Diagnosis not present

## 2023-06-26 DIAGNOSIS — M545 Low back pain, unspecified: Secondary | ICD-10-CM | POA: Diagnosis not present

## 2023-06-26 DIAGNOSIS — M79669 Pain in unspecified lower leg: Secondary | ICD-10-CM | POA: Diagnosis not present

## 2023-06-26 DIAGNOSIS — M25519 Pain in unspecified shoulder: Secondary | ICD-10-CM | POA: Diagnosis not present

## 2023-06-30 DIAGNOSIS — L239 Allergic contact dermatitis, unspecified cause: Secondary | ICD-10-CM | POA: Diagnosis not present

## 2023-07-04 DIAGNOSIS — M25519 Pain in unspecified shoulder: Secondary | ICD-10-CM | POA: Diagnosis not present

## 2023-07-04 DIAGNOSIS — M545 Low back pain, unspecified: Secondary | ICD-10-CM | POA: Diagnosis not present

## 2023-07-04 DIAGNOSIS — M79669 Pain in unspecified lower leg: Secondary | ICD-10-CM | POA: Diagnosis not present

## 2023-07-10 DIAGNOSIS — M79669 Pain in unspecified lower leg: Secondary | ICD-10-CM | POA: Diagnosis not present

## 2023-07-10 DIAGNOSIS — M545 Low back pain, unspecified: Secondary | ICD-10-CM | POA: Diagnosis not present

## 2023-07-10 DIAGNOSIS — M25519 Pain in unspecified shoulder: Secondary | ICD-10-CM | POA: Diagnosis not present

## 2023-07-25 DIAGNOSIS — M25519 Pain in unspecified shoulder: Secondary | ICD-10-CM | POA: Diagnosis not present

## 2023-07-25 DIAGNOSIS — M79669 Pain in unspecified lower leg: Secondary | ICD-10-CM | POA: Diagnosis not present

## 2023-07-25 DIAGNOSIS — M545 Low back pain, unspecified: Secondary | ICD-10-CM | POA: Diagnosis not present

## 2023-07-30 DIAGNOSIS — M545 Low back pain, unspecified: Secondary | ICD-10-CM | POA: Diagnosis not present

## 2023-07-30 DIAGNOSIS — M79669 Pain in unspecified lower leg: Secondary | ICD-10-CM | POA: Diagnosis not present

## 2023-07-30 DIAGNOSIS — M25519 Pain in unspecified shoulder: Secondary | ICD-10-CM | POA: Diagnosis not present

## 2023-08-09 DIAGNOSIS — M545 Low back pain, unspecified: Secondary | ICD-10-CM | POA: Diagnosis not present

## 2023-08-09 DIAGNOSIS — M25519 Pain in unspecified shoulder: Secondary | ICD-10-CM | POA: Diagnosis not present

## 2023-08-09 DIAGNOSIS — M79669 Pain in unspecified lower leg: Secondary | ICD-10-CM | POA: Diagnosis not present

## 2023-08-12 DIAGNOSIS — M79669 Pain in unspecified lower leg: Secondary | ICD-10-CM | POA: Diagnosis not present

## 2023-08-12 DIAGNOSIS — M545 Low back pain, unspecified: Secondary | ICD-10-CM | POA: Diagnosis not present

## 2023-08-12 DIAGNOSIS — M25519 Pain in unspecified shoulder: Secondary | ICD-10-CM | POA: Diagnosis not present

## 2023-08-12 DIAGNOSIS — Z6841 Body Mass Index (BMI) 40.0 and over, adult: Secondary | ICD-10-CM | POA: Diagnosis not present

## 2023-08-12 DIAGNOSIS — E66813 Obesity, class 3: Secondary | ICD-10-CM | POA: Diagnosis not present

## 2023-08-12 DIAGNOSIS — Z7189 Other specified counseling: Secondary | ICD-10-CM | POA: Diagnosis not present

## 2023-08-22 DIAGNOSIS — M545 Low back pain, unspecified: Secondary | ICD-10-CM | POA: Diagnosis not present

## 2023-08-22 DIAGNOSIS — M25519 Pain in unspecified shoulder: Secondary | ICD-10-CM | POA: Diagnosis not present

## 2023-08-22 DIAGNOSIS — M79669 Pain in unspecified lower leg: Secondary | ICD-10-CM | POA: Diagnosis not present

## 2023-08-26 DIAGNOSIS — M25519 Pain in unspecified shoulder: Secondary | ICD-10-CM | POA: Diagnosis not present

## 2023-08-26 DIAGNOSIS — M545 Low back pain, unspecified: Secondary | ICD-10-CM | POA: Diagnosis not present

## 2023-08-26 DIAGNOSIS — M79669 Pain in unspecified lower leg: Secondary | ICD-10-CM | POA: Diagnosis not present

## 2023-09-03 DIAGNOSIS — M79669 Pain in unspecified lower leg: Secondary | ICD-10-CM | POA: Diagnosis not present

## 2023-09-03 DIAGNOSIS — M545 Low back pain, unspecified: Secondary | ICD-10-CM | POA: Diagnosis not present

## 2023-09-03 DIAGNOSIS — M25519 Pain in unspecified shoulder: Secondary | ICD-10-CM | POA: Diagnosis not present

## 2023-09-09 DIAGNOSIS — Z6841 Body Mass Index (BMI) 40.0 and over, adult: Secondary | ICD-10-CM | POA: Diagnosis not present

## 2023-09-09 DIAGNOSIS — E66813 Obesity, class 3: Secondary | ICD-10-CM | POA: Diagnosis not present

## 2023-09-09 DIAGNOSIS — Z7189 Other specified counseling: Secondary | ICD-10-CM | POA: Diagnosis not present

## 2023-09-12 DIAGNOSIS — M79669 Pain in unspecified lower leg: Secondary | ICD-10-CM | POA: Diagnosis not present

## 2023-09-12 DIAGNOSIS — M545 Low back pain, unspecified: Secondary | ICD-10-CM | POA: Diagnosis not present

## 2023-09-12 DIAGNOSIS — M25519 Pain in unspecified shoulder: Secondary | ICD-10-CM | POA: Diagnosis not present

## 2023-10-02 DIAGNOSIS — M545 Low back pain, unspecified: Secondary | ICD-10-CM | POA: Diagnosis not present

## 2023-10-02 DIAGNOSIS — M25519 Pain in unspecified shoulder: Secondary | ICD-10-CM | POA: Diagnosis not present

## 2023-10-02 DIAGNOSIS — M79669 Pain in unspecified lower leg: Secondary | ICD-10-CM | POA: Diagnosis not present

## 2023-10-07 DIAGNOSIS — Z7189 Other specified counseling: Secondary | ICD-10-CM | POA: Diagnosis not present

## 2023-10-07 DIAGNOSIS — Z6841 Body Mass Index (BMI) 40.0 and over, adult: Secondary | ICD-10-CM | POA: Diagnosis not present

## 2023-10-07 DIAGNOSIS — E66813 Obesity, class 3: Secondary | ICD-10-CM | POA: Diagnosis not present

## 2023-10-17 DIAGNOSIS — M545 Low back pain, unspecified: Secondary | ICD-10-CM | POA: Diagnosis not present

## 2023-10-17 DIAGNOSIS — M79669 Pain in unspecified lower leg: Secondary | ICD-10-CM | POA: Diagnosis not present

## 2023-10-17 DIAGNOSIS — M25519 Pain in unspecified shoulder: Secondary | ICD-10-CM | POA: Diagnosis not present

## 2023-10-23 DIAGNOSIS — M545 Low back pain, unspecified: Secondary | ICD-10-CM | POA: Diagnosis not present

## 2023-10-23 DIAGNOSIS — M79669 Pain in unspecified lower leg: Secondary | ICD-10-CM | POA: Diagnosis not present

## 2023-10-23 DIAGNOSIS — M25519 Pain in unspecified shoulder: Secondary | ICD-10-CM | POA: Diagnosis not present

## 2023-10-31 DIAGNOSIS — M25519 Pain in unspecified shoulder: Secondary | ICD-10-CM | POA: Diagnosis not present

## 2023-10-31 DIAGNOSIS — M545 Low back pain, unspecified: Secondary | ICD-10-CM | POA: Diagnosis not present

## 2023-10-31 DIAGNOSIS — M79669 Pain in unspecified lower leg: Secondary | ICD-10-CM | POA: Diagnosis not present

## 2023-11-01 DIAGNOSIS — Z7189 Other specified counseling: Secondary | ICD-10-CM | POA: Diagnosis not present

## 2023-11-01 DIAGNOSIS — E66813 Obesity, class 3: Secondary | ICD-10-CM | POA: Diagnosis not present

## 2023-11-01 DIAGNOSIS — Z6841 Body Mass Index (BMI) 40.0 and over, adult: Secondary | ICD-10-CM | POA: Diagnosis not present

## 2023-11-06 DIAGNOSIS — M25519 Pain in unspecified shoulder: Secondary | ICD-10-CM | POA: Diagnosis not present

## 2023-11-06 DIAGNOSIS — M545 Low back pain, unspecified: Secondary | ICD-10-CM | POA: Diagnosis not present

## 2023-11-06 DIAGNOSIS — M79669 Pain in unspecified lower leg: Secondary | ICD-10-CM | POA: Diagnosis not present

## 2023-11-12 DIAGNOSIS — M79669 Pain in unspecified lower leg: Secondary | ICD-10-CM | POA: Diagnosis not present

## 2023-11-12 DIAGNOSIS — M25519 Pain in unspecified shoulder: Secondary | ICD-10-CM | POA: Diagnosis not present

## 2023-11-12 DIAGNOSIS — M545 Low back pain, unspecified: Secondary | ICD-10-CM | POA: Diagnosis not present

## 2023-11-20 DIAGNOSIS — M545 Low back pain, unspecified: Secondary | ICD-10-CM | POA: Diagnosis not present

## 2023-11-20 DIAGNOSIS — M25519 Pain in unspecified shoulder: Secondary | ICD-10-CM | POA: Diagnosis not present

## 2023-11-20 DIAGNOSIS — M79669 Pain in unspecified lower leg: Secondary | ICD-10-CM | POA: Diagnosis not present

## 2023-11-28 DIAGNOSIS — M25519 Pain in unspecified shoulder: Secondary | ICD-10-CM | POA: Diagnosis not present

## 2023-11-28 DIAGNOSIS — M79669 Pain in unspecified lower leg: Secondary | ICD-10-CM | POA: Diagnosis not present

## 2023-11-28 DIAGNOSIS — H40013 Open angle with borderline findings, low risk, bilateral: Secondary | ICD-10-CM | POA: Diagnosis not present

## 2023-11-28 DIAGNOSIS — M545 Low back pain, unspecified: Secondary | ICD-10-CM | POA: Diagnosis not present

## 2023-12-10 DIAGNOSIS — M545 Low back pain, unspecified: Secondary | ICD-10-CM | POA: Diagnosis not present

## 2023-12-10 DIAGNOSIS — M79669 Pain in unspecified lower leg: Secondary | ICD-10-CM | POA: Diagnosis not present

## 2023-12-10 DIAGNOSIS — M25519 Pain in unspecified shoulder: Secondary | ICD-10-CM | POA: Diagnosis not present

## 2023-12-19 DIAGNOSIS — M79669 Pain in unspecified lower leg: Secondary | ICD-10-CM | POA: Diagnosis not present

## 2023-12-19 DIAGNOSIS — M25519 Pain in unspecified shoulder: Secondary | ICD-10-CM | POA: Diagnosis not present

## 2023-12-19 DIAGNOSIS — M545 Low back pain, unspecified: Secondary | ICD-10-CM | POA: Diagnosis not present

## 2023-12-25 DIAGNOSIS — M25519 Pain in unspecified shoulder: Secondary | ICD-10-CM | POA: Diagnosis not present

## 2023-12-25 DIAGNOSIS — M545 Low back pain, unspecified: Secondary | ICD-10-CM | POA: Diagnosis not present

## 2023-12-25 DIAGNOSIS — M79669 Pain in unspecified lower leg: Secondary | ICD-10-CM | POA: Diagnosis not present

## 2024-01-07 DIAGNOSIS — M25519 Pain in unspecified shoulder: Secondary | ICD-10-CM | POA: Diagnosis not present

## 2024-01-07 DIAGNOSIS — M79669 Pain in unspecified lower leg: Secondary | ICD-10-CM | POA: Diagnosis not present

## 2024-01-07 DIAGNOSIS — M545 Low back pain, unspecified: Secondary | ICD-10-CM | POA: Diagnosis not present

## 2024-01-22 DIAGNOSIS — M25519 Pain in unspecified shoulder: Secondary | ICD-10-CM | POA: Diagnosis not present

## 2024-01-22 DIAGNOSIS — Z1231 Encounter for screening mammogram for malignant neoplasm of breast: Secondary | ICD-10-CM | POA: Diagnosis not present

## 2024-01-22 DIAGNOSIS — M545 Low back pain, unspecified: Secondary | ICD-10-CM | POA: Diagnosis not present

## 2024-01-22 DIAGNOSIS — Z01411 Encounter for gynecological examination (general) (routine) with abnormal findings: Secondary | ICD-10-CM | POA: Diagnosis not present

## 2024-01-22 DIAGNOSIS — M79669 Pain in unspecified lower leg: Secondary | ICD-10-CM | POA: Diagnosis not present

## 2024-02-04 DIAGNOSIS — M25519 Pain in unspecified shoulder: Secondary | ICD-10-CM | POA: Diagnosis not present

## 2024-02-04 DIAGNOSIS — M545 Low back pain, unspecified: Secondary | ICD-10-CM | POA: Diagnosis not present

## 2024-02-04 DIAGNOSIS — M79669 Pain in unspecified lower leg: Secondary | ICD-10-CM | POA: Diagnosis not present

## 2024-02-13 DIAGNOSIS — M545 Low back pain, unspecified: Secondary | ICD-10-CM | POA: Diagnosis not present

## 2024-02-13 DIAGNOSIS — M25519 Pain in unspecified shoulder: Secondary | ICD-10-CM | POA: Diagnosis not present

## 2024-02-13 DIAGNOSIS — M79669 Pain in unspecified lower leg: Secondary | ICD-10-CM | POA: Diagnosis not present

## 2024-02-18 DIAGNOSIS — M79669 Pain in unspecified lower leg: Secondary | ICD-10-CM | POA: Diagnosis not present

## 2024-02-18 DIAGNOSIS — M545 Low back pain, unspecified: Secondary | ICD-10-CM | POA: Diagnosis not present

## 2024-02-18 DIAGNOSIS — M25519 Pain in unspecified shoulder: Secondary | ICD-10-CM | POA: Diagnosis not present

## 2024-02-27 DIAGNOSIS — M25519 Pain in unspecified shoulder: Secondary | ICD-10-CM | POA: Diagnosis not present

## 2024-02-27 DIAGNOSIS — M79669 Pain in unspecified lower leg: Secondary | ICD-10-CM | POA: Diagnosis not present

## 2024-02-27 DIAGNOSIS — M545 Low back pain, unspecified: Secondary | ICD-10-CM | POA: Diagnosis not present

## 2024-03-13 DIAGNOSIS — M545 Low back pain, unspecified: Secondary | ICD-10-CM | POA: Diagnosis not present

## 2024-03-13 DIAGNOSIS — M25519 Pain in unspecified shoulder: Secondary | ICD-10-CM | POA: Diagnosis not present

## 2024-03-13 DIAGNOSIS — M79669 Pain in unspecified lower leg: Secondary | ICD-10-CM | POA: Diagnosis not present

## 2024-03-17 DIAGNOSIS — M545 Low back pain, unspecified: Secondary | ICD-10-CM | POA: Diagnosis not present

## 2024-03-17 DIAGNOSIS — M79669 Pain in unspecified lower leg: Secondary | ICD-10-CM | POA: Diagnosis not present

## 2024-03-17 DIAGNOSIS — M25519 Pain in unspecified shoulder: Secondary | ICD-10-CM | POA: Diagnosis not present

## 2024-03-24 DIAGNOSIS — M25519 Pain in unspecified shoulder: Secondary | ICD-10-CM | POA: Diagnosis not present

## 2024-03-24 DIAGNOSIS — M545 Low back pain, unspecified: Secondary | ICD-10-CM | POA: Diagnosis not present

## 2024-03-24 DIAGNOSIS — Z0000 Encounter for general adult medical examination without abnormal findings: Secondary | ICD-10-CM | POA: Diagnosis not present

## 2024-03-24 DIAGNOSIS — M79669 Pain in unspecified lower leg: Secondary | ICD-10-CM | POA: Diagnosis not present

## 2024-03-24 DIAGNOSIS — R7303 Prediabetes: Secondary | ICD-10-CM | POA: Diagnosis not present

## 2024-03-24 DIAGNOSIS — Z23 Encounter for immunization: Secondary | ICD-10-CM | POA: Diagnosis not present

## 2024-03-24 DIAGNOSIS — E7800 Pure hypercholesterolemia, unspecified: Secondary | ICD-10-CM | POA: Diagnosis not present

## 2024-04-02 DIAGNOSIS — M25519 Pain in unspecified shoulder: Secondary | ICD-10-CM | POA: Diagnosis not present

## 2024-04-02 DIAGNOSIS — M79669 Pain in unspecified lower leg: Secondary | ICD-10-CM | POA: Diagnosis not present

## 2024-04-02 DIAGNOSIS — M545 Low back pain, unspecified: Secondary | ICD-10-CM | POA: Diagnosis not present

## 2024-04-06 DIAGNOSIS — M545 Low back pain, unspecified: Secondary | ICD-10-CM | POA: Diagnosis not present

## 2024-04-06 DIAGNOSIS — M25519 Pain in unspecified shoulder: Secondary | ICD-10-CM | POA: Diagnosis not present

## 2024-04-06 DIAGNOSIS — M79669 Pain in unspecified lower leg: Secondary | ICD-10-CM | POA: Diagnosis not present

## 2024-04-17 DIAGNOSIS — M25519 Pain in unspecified shoulder: Secondary | ICD-10-CM | POA: Diagnosis not present

## 2024-04-17 DIAGNOSIS — M545 Low back pain, unspecified: Secondary | ICD-10-CM | POA: Diagnosis not present

## 2024-04-17 DIAGNOSIS — M79669 Pain in unspecified lower leg: Secondary | ICD-10-CM | POA: Diagnosis not present
